# Patient Record
Sex: Male | Born: 1985 | Race: Black or African American | Hispanic: No | Marital: Single | Smoking: Former smoker
Health system: Southern US, Community
[De-identification: ages and names within clinical notes are randomized; demographics above are authoritative.]

## PROBLEM LIST (undated history)

## (undated) DIAGNOSIS — G43019 Migraine without aura, intractable, without status migrainosus: Secondary | ICD-10-CM

## (undated) DIAGNOSIS — R12 Heartburn: Secondary | ICD-10-CM

## (undated) DIAGNOSIS — G43909 Migraine, unspecified, not intractable, without status migrainosus: Secondary | ICD-10-CM

## (undated) HISTORY — DX: Heartburn: R12

## (undated) HISTORY — DX: Migraine without aura, intractable, without status migrainosus: G43.019

## (undated) HISTORY — PX: WISDOM TOOTH EXTRACTION: SHX21

## (undated) HISTORY — PX: KNEE ARTHROSCOPY: SUR90

## (undated) HISTORY — DX: Migraine, unspecified, not intractable, without status migrainosus: G43.909

---

## 2004-11-26 ENCOUNTER — Encounter: Admission: RE | Admit: 2004-11-26 | Discharge: 2004-11-26 | Payer: Self-pay | Admitting: Internal Medicine

## 2010-09-08 ENCOUNTER — Emergency Department (HOSPITAL_COMMUNITY): Admission: EM | Admit: 2010-09-08 | Discharge: 2010-09-08 | Payer: Self-pay | Admitting: Family Medicine

## 2011-01-22 LAB — POCT URINALYSIS DIPSTICK
Bilirubin Urine: NEGATIVE
Glucose, UA: NEGATIVE mg/dL
Ketones, ur: NEGATIVE mg/dL
Specific Gravity, Urine: 1.025 (ref 1.005–1.030)
Urobilinogen, UA: 0.2 mg/dL (ref 0.0–1.0)

## 2011-01-22 LAB — HEMOCCULT GUIAC POC 1CARD (OFFICE): Fecal Occult Bld: POSITIVE

## 2012-07-03 ENCOUNTER — Emergency Department (INDEPENDENT_AMBULATORY_CARE_PROVIDER_SITE_OTHER)
Admission: EM | Admit: 2012-07-03 | Discharge: 2012-07-03 | Disposition: A | Discharge status: Home / Self Care | Payer: 59 | Source: Home / Self Care

## 2012-07-03 ENCOUNTER — Encounter (HOSPITAL_COMMUNITY): Payer: Self-pay | Admitting: Emergency Medicine

## 2012-07-03 DIAGNOSIS — R21 Rash and other nonspecific skin eruption: Secondary | ICD-10-CM

## 2012-07-03 DIAGNOSIS — Z2089 Contact with and (suspected) exposure to other communicable diseases: Secondary | ICD-10-CM

## 2012-07-03 MED ORDER — PERMETHRIN 5 % EX CREA: TOPICAL_CREAM | CUTANEOUS | Status: AC

## 2012-07-03 NOTE — ED Notes (Signed)
Patient denies rash or bumps, no itching.  Patient reports close friend has been told they have scabies, he is around them a lot and is concerned for the same

## 2012-07-03 NOTE — ED Provider Notes (Signed)
History     CSN: [id number]  Arrival date & time 07/03/12  1318   None     Chief Complaint  Patient presents with  . Rash    (Consider location/radiation/quality/duration/timing/severity/associated sxs/prior treatment) Patient is a 26 y.o. male presenting with rash. The history is provided by the patient.  Rash   This patient reports scabies exposure, girlfriend nightly sleeping partner was diagnosed with scabies 3 days ago.  States itchy chest and arms.    Location: chest  Onset: 2 days ago   Course: unchanged Self-treated with: nothing          Improvement with treatment: no  History Itching: yes  Tenderness: no  New medications/antibiotics: no  Pet exposure: no  Recent travel or tropical exposure: no  New soaps, shampoos, detergent, clothing: no Tick/insect exposure: no   Red Flags Feeling ill: no Fever:no Facial/tongue swelling/difficulty breathing:  no  Diabetic or immunocompromised: no  History reviewed. No pertinent past medical history.  Past Surgical History  Procedure Date  . Knee arthroscopy     right and left knee    No family history on file.  History  Substance Use Topics  . Smoking status: Current Everyday Smoker  . Smokeless tobacco: Not on file  . Alcohol Use: Yes      Review of Systems  Constitutional: Negative.   Respiratory: Negative.   Cardiovascular: Negative.   Skin: Positive for rash.    Allergies  Review of patient's allergies indicates no known allergies.  Home Medications   Current Outpatient Rx  Name Route Sig Dispense Refill  . PERMETHRIN 5 % EX CREA  Apply to affected area once 60 g 0    BP 137/82  Pulse 78  Temp 97.8 F (36.6 C) (Oral)  Resp 14  SpO2 97%  Physical Exam  Nursing note and vitals reviewed. Constitutional: He is oriented to person, place, and time. Vital signs are normal. He appears well-developed and well-nourished. He is active and cooperative.  HENT:  Head: Normocephalic.  Eyes:  Conjunctivae are normal. Pupils are equal, round, and reactive to light. No scleral icterus.  Neck: Trachea normal. Neck supple.  Cardiovascular: Normal rate, regular rhythm and normal heart sounds.   Pulmonary/Chest: Effort normal and breath sounds normal. No respiratory distress.  Neurological: He is alert and oriented to person, place, and time. No cranial nerve deficit or sensory deficit.  Skin: Skin is warm and dry. Rash noted.       Anterior chest  Psychiatric: He has a normal mood and affect. His speech is normal and behavior is normal. Judgment and thought content normal. Cognition and memory are normal.    ED Course  Procedures (including critical care time)  Labs Reviewed - No data to display No results found.   1. Scabies exposure   2. Rash       MDM  Cool showers; avoid heat, sunlight and anything that makes condition worse.  Continue Zyrtec for at least seven days.  Begin Medrol dosepak tomorrow-follow instructions.  RTC if symptoms do not improve or begin to have problems swallowing, breathing or significant change in condition.        Johnsie Kindred, NP 07/03/12 2059

## 2012-07-04 NOTE — ED Provider Notes (Signed)
Medical screening examination/treatment/procedure(s) were performed by resident physician or non-physician practitioner and as supervising physician I was immediately available for consultation/collaboration.   Aziah Brostrom DOUGLAS MD.    Hoover Grewe D Dalanie Kisner, MD 07/04/12 1647 

## 2013-02-20 ENCOUNTER — Emergency Department (HOSPITAL_COMMUNITY)
Admission: EM | Admit: 2013-02-20 | Discharge: 2013-02-20 | Disposition: A | Discharge status: Home / Self Care | Payer: 59 | Source: Home / Self Care | Attending: Family Medicine | Admitting: Family Medicine

## 2013-02-20 ENCOUNTER — Encounter (HOSPITAL_COMMUNITY): Payer: Self-pay | Admitting: *Deleted

## 2013-02-20 DIAGNOSIS — J020 Streptococcal pharyngitis: Secondary | ICD-10-CM

## 2013-02-20 LAB — POCT RAPID STREP A: Streptococcus, Group A Screen (Direct): POSITIVE — AB

## 2013-02-20 MED ORDER — HYDROCODONE-ACETAMINOPHEN 7.5-325 MG/15ML PO SOLN: 10.0000 mL | Freq: Three times a day (TID) | ORAL | Status: DC | PRN

## 2013-02-20 MED ORDER — IBUPROFEN 600 MG PO TABS: 600.0000 mg | ORAL_TABLET | Freq: Three times a day (TID) | ORAL | Status: DC | PRN

## 2013-02-20 MED ORDER — AMOXICILLIN 500 MG PO CAPS: 500.0000 mg | ORAL_CAPSULE | Freq: Three times a day (TID) | ORAL | Status: DC

## 2013-02-20 NOTE — ED Notes (Signed)
C/O sore throat onset Wed.  Also has some nasal congestion, runny nose, productive cough without fevers.  Went to an urgent care Fri and was given Kenalog and Decadron injections; had neg strep.  States sore throat is actually getting worse; throat appears red and inflamed.  Had taken Tyl Severe Cold with some improvement in congestion.

## 2013-02-20 NOTE — ED Provider Notes (Signed)
History     CSN: [id number]  Arrival date & time 02/20/13  1554   First MD Initiated Contact with Patient 02/20/13 1606      Chief Complaint  Patient presents with  . Sore Throat    (Consider location/radiation/quality/duration/timing/severity/associated sxs/prior treatment) HPI Comments: 27 year old smoker male with otherwise no significant past medical history. Here complaining of nasal congestion, cough and runny nose for several days. Patient reports started to have a sore throat in the last 4 days. He was seen at another urgent care 2 days ago and had a steroid injection and had a negative strep test. Reports his sore throat is worse. Taking over-the-counter Tylenol without significant relief. Denies fever or chills. No headache or abdominal pain. No chest pain or difficulty breathing. No wheezing. No rash.   History reviewed. No pertinent past medical history.  Past Surgical History  Procedure Laterality Date  . Knee arthroscopy      right and left knee    No family history on file.  History  Substance Use Topics  . Smoking status: Current Some Day Smoker    Types: Cigars  . Smokeless tobacco: Not on file  . Alcohol Use: Yes     Comment: Drinks 4-5 drinks per day x 4-5 days/wk; combination beer and liquor      Review of Systems  Constitutional: Negative for fever, chills, diaphoresis and fatigue.  HENT: Positive for sore throat and rhinorrhea. Negative for trouble swallowing and neck pain.   Eyes: Negative for pain and redness.  Respiratory: Positive for cough. Negative for shortness of breath and wheezing.   Gastrointestinal: Negative for nausea, vomiting, abdominal pain and diarrhea.  Musculoskeletal: Negative for arthralgias.  Skin: Negative for rash.  Neurological: Negative for dizziness and headaches.    Allergies  Review of patient's allergies indicates no known allergies.  Home Medications   Current Outpatient Rx  Name  Route  Sig  Dispense  Refill   . amoxicillin (AMOXIL) 500 MG capsule   Oral   Take 1 capsule (500 mg total) by mouth 3 (three) times daily.   21 capsule   0   . HYDROcodone-acetaminophen (HYCET) 7.5-325 mg/15 ml solution   Oral   Take 10 mLs by mouth every 8 (eight) hours as needed for pain or cough.   120 mL   0   . ibuprofen (ADVIL,MOTRIN) 600 MG tablet   Oral   Take 1 tablet (600 mg total) by mouth every 8 (eight) hours as needed for pain.   30 tablet   0     BP 136/77  Pulse 72  Temp(Src) 98.5 F (36.9 C) (Oral)  Resp 16  SpO2 97%  Physical Exam  Nursing note and vitals reviewed. Constitutional: He is oriented to person, place, and time. He appears well-developed and well-nourished. No distress.  HENT:  Head: Atraumatic.  Right Ear: External ear normal.  Left Ear: External ear normal.  Nasal Congestion with erythema and swelling of nasal turbinates, clear rhinorrhea. Significant pharyngeal erythema no exudates. No uvula deviation. No trismus. TM's with increased vascular markings and some dullness bilaterally no swelling or bulging.  Eyes: Conjunctivae are normal. Right eye exhibits no discharge. Left eye exhibits no discharge. No scleral icterus.  Neck: No JVD present.  Cardiovascular: Normal rate, regular rhythm and normal heart sounds.   No murmur heard. Pulmonary/Chest: Effort normal and breath sounds normal. No respiratory distress. He has no wheezes. He has no rales. He exhibits no tenderness.  Abdominal: Soft. There  is no tenderness.  No hepatosplenomegaly  Lymphadenopathy:    He has cervical adenopathy.  Neurological: He is alert and oriented to person, place, and time.  Skin: No rash noted. He is not diaphoretic.    ED Course  Procedures (including critical care time)  Labs Reviewed  POCT RAPID STREP A ([facility] ONLY) - Abnormal; Notable for the following:    Streptococcus, Group A Screen (Direct) POSITIVE (*)    All other components within normal limits   No results  found.   1. Strep pharyngitis       MDM   Treated with amoxicillin, hydrocodone/acetaminophen and ibuprofen. Supportive care and red flags should prompt his return to medical attention discussed with patient and provided in writing.       Sharin Grave, MD 02/20/13 1751

## 2015-04-25 ENCOUNTER — Other Ambulatory Visit: Payer: Self-pay | Admitting: Family Medicine

## 2015-04-25 DIAGNOSIS — R0989 Other specified symptoms and signs involving the circulatory and respiratory systems: Secondary | ICD-10-CM

## 2015-04-26 ENCOUNTER — Ambulatory Visit
Admission: RE | Admit: 2015-04-26 | Discharge: 2015-04-26 | Disposition: A | Discharge status: Home / Self Care | Payer: 59 | Source: Ambulatory Visit | Attending: Family Medicine | Admitting: Family Medicine

## 2015-04-26 DIAGNOSIS — R0989 Other specified symptoms and signs involving the circulatory and respiratory systems: Secondary | ICD-10-CM

## 2016-04-24 ENCOUNTER — Other Ambulatory Visit: Payer: Self-pay | Admitting: Family Medicine

## 2016-04-24 DIAGNOSIS — R1032 Left lower quadrant pain: Secondary | ICD-10-CM

## 2016-05-06 ENCOUNTER — Ambulatory Visit
Admission: RE | Admit: 2016-05-06 | Discharge: 2016-05-06 | Disposition: A | Discharge status: Home / Self Care | Payer: 59 | Source: Ambulatory Visit | Attending: Family Medicine | Admitting: Family Medicine

## 2016-05-06 DIAGNOSIS — R1032 Left lower quadrant pain: Secondary | ICD-10-CM

## 2016-05-06 IMAGING — CT CT ABD-PELV W/ CM
2 of 4 series · 16 of 46 positions shown, 18 images · IV contrast (APPLIED)
Comparison: None.

CLINICAL DATA: Patient with left lower quadrant pain for multiple
months

EXAM:
CT ABDOMEN AND PELVIS WITH CONTRAST
TECHNIQUE: Multidetector CT imaging of the abdomen and pelvis was performed
using the standard protocol following bolus administration of
intravenous contrast.
CONTRAST:  100mL [2L] IOPAMIDOL ([2L]) INJECTION 61%

[Series 2: abd/pelvis w/cm · axial · 0.79mm/px · z∈[+563,+1008]mm · 13 of 99 slices shown, 15 images]
[im 5/99  soft-tissue]
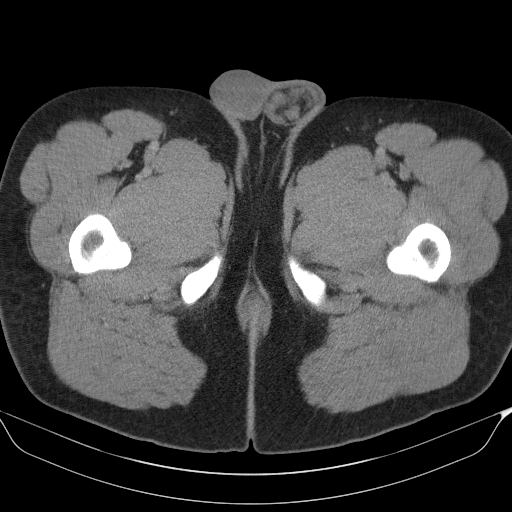
[im 5/99  bone]
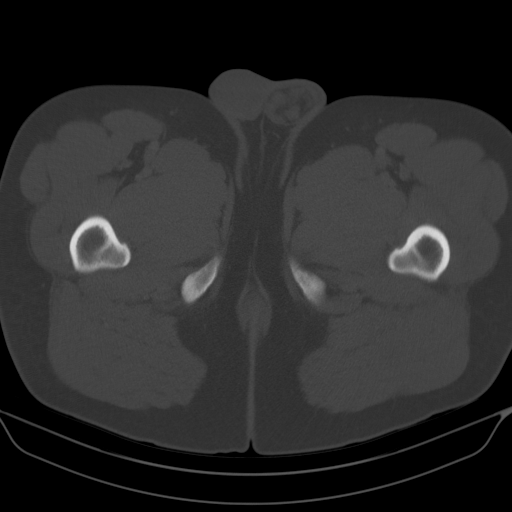
[im 13/99  soft-tissue]
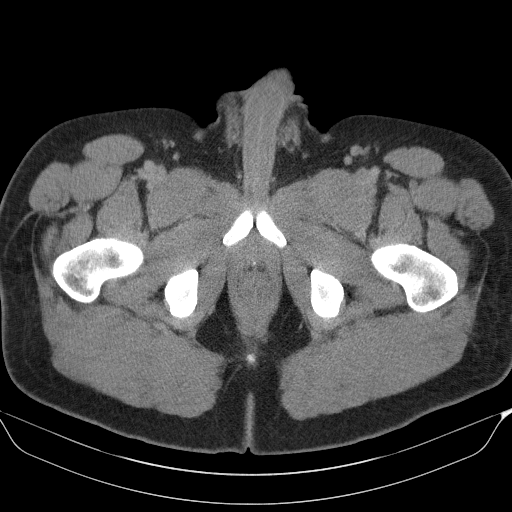
[im 21/99  soft-tissue]
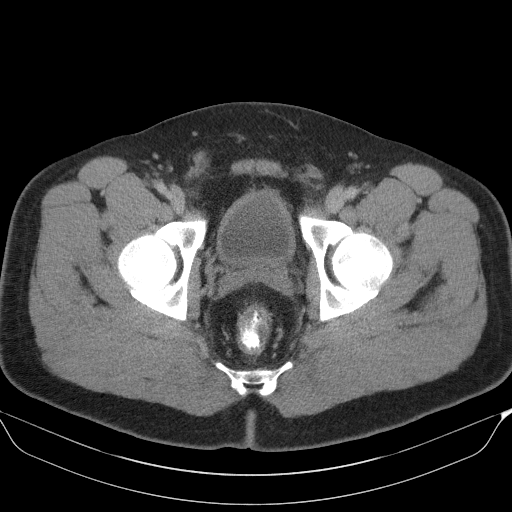
[im 29/99  soft-tissue]
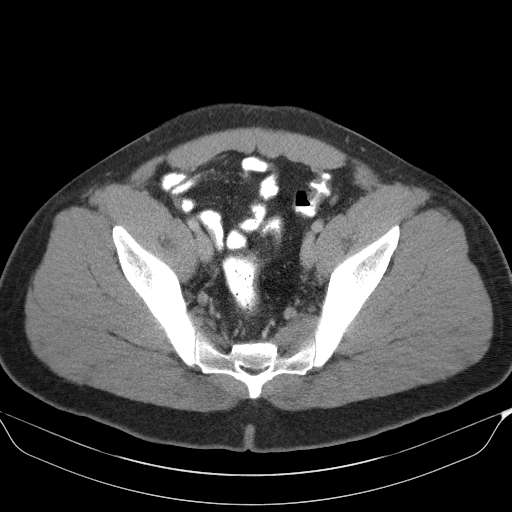
[im 33/99  soft-tissue]
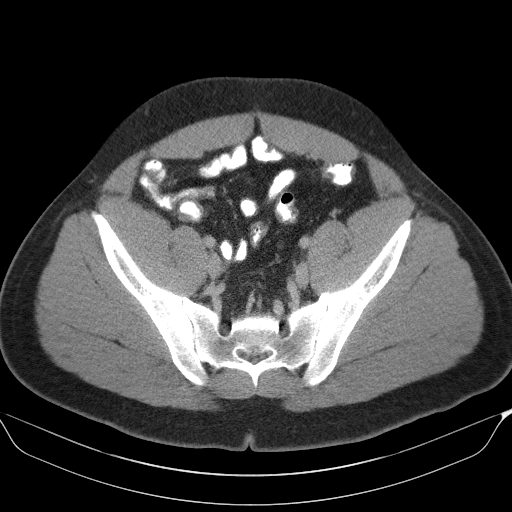
[im 41/99  soft-tissue]
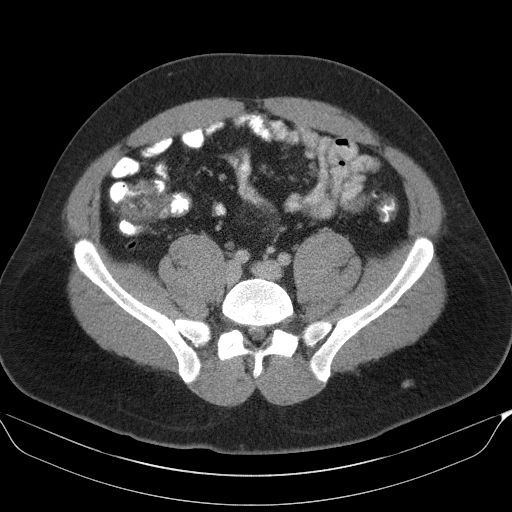
[im 50/99  soft-tissue]
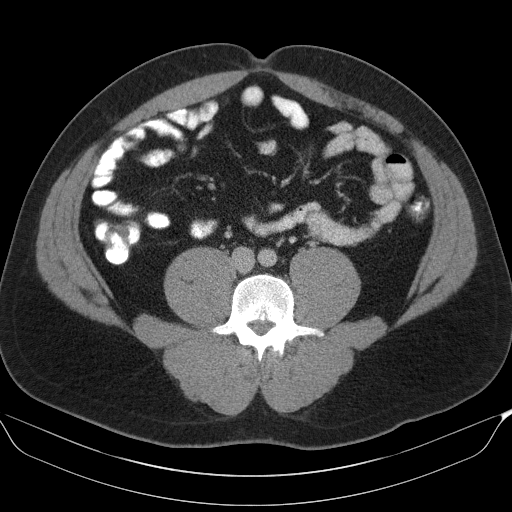
[im 58/99  soft-tissue]
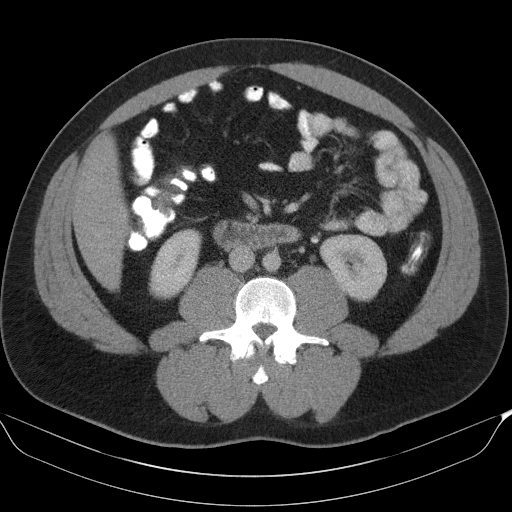
[im 66/99  soft-tissue]
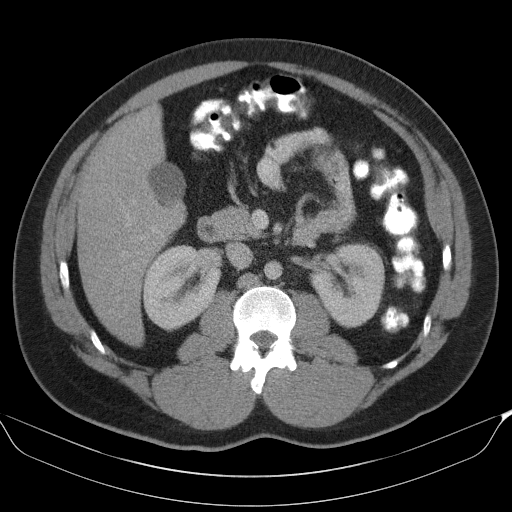
[im 66/99  bone]
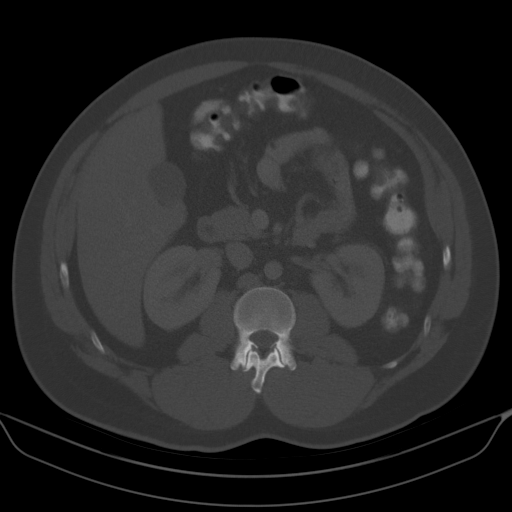
[im 70/99  soft-tissue]
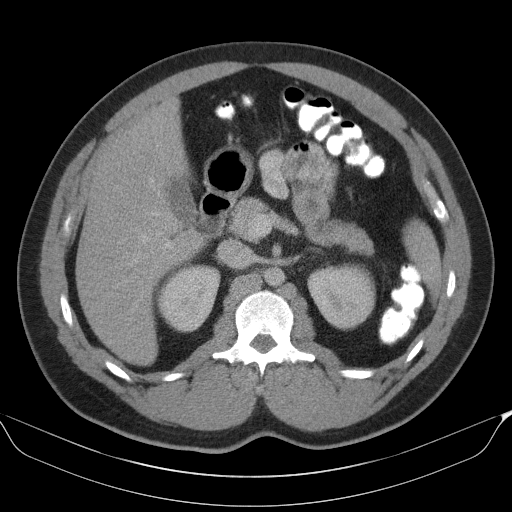
[im 78/99  soft-tissue]
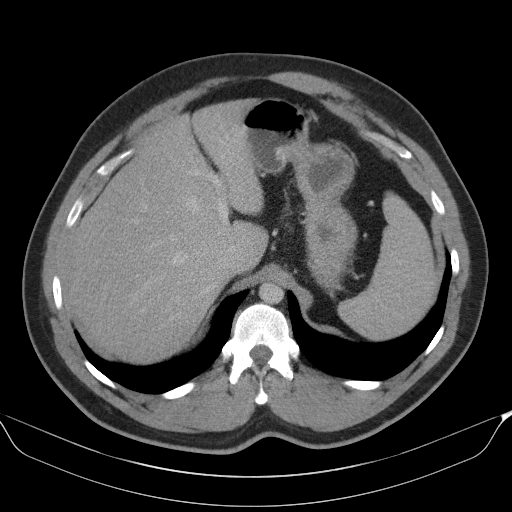
[im 86/99  soft-tissue]
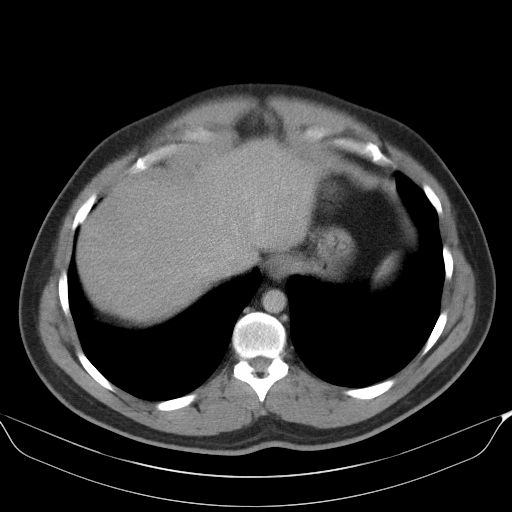
[im 94/99  soft-tissue]
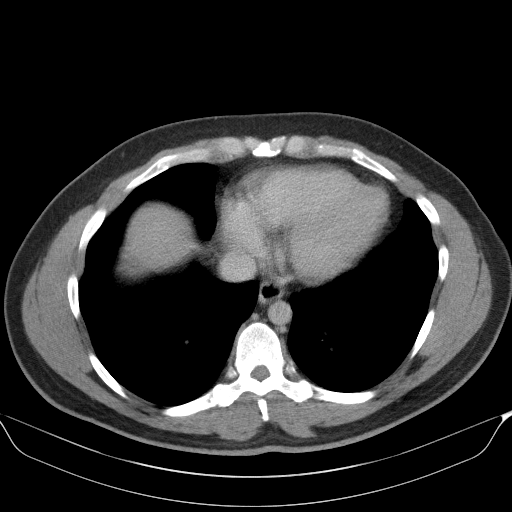

[Series 3: cor · coronal · 0.77mm/px · 3 of 108 slices shown]
[im 36/108  soft-tissue]
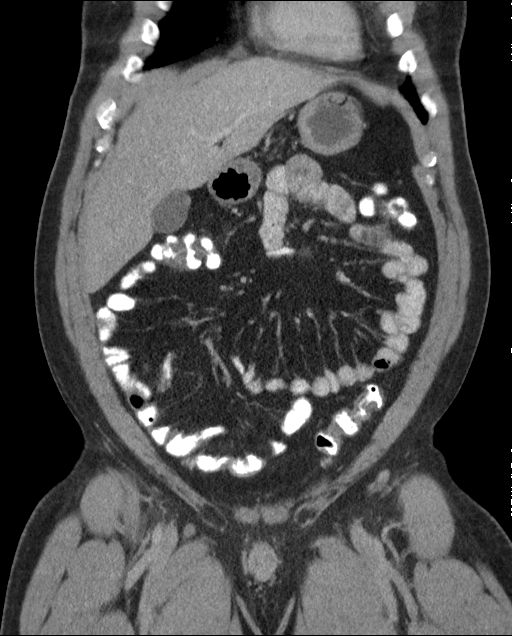
[im 48/108  soft-tissue]
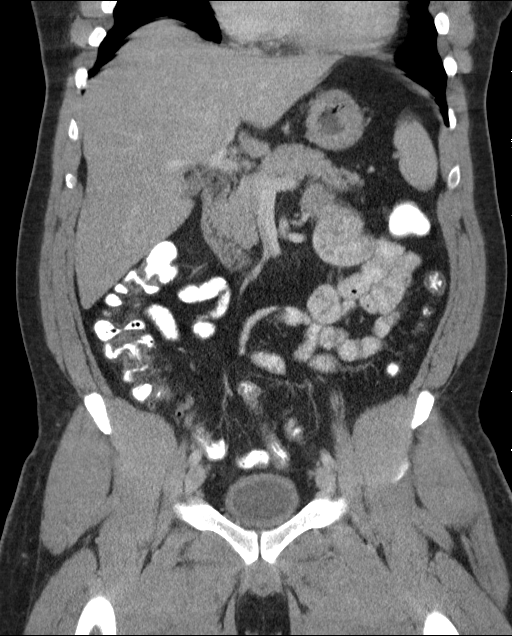
[im 60/108  soft-tissue]
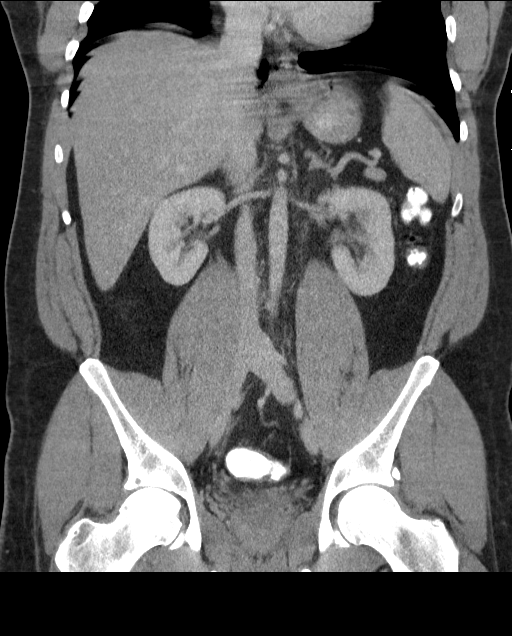

[16 of 46 positions shown; findings below may reference images not displayed]

FINDINGS: Lower chest: Normal heart size. Lung bases are clear. No
consolidative pulmonary opacities. No pleural effusion.

Hepatobiliary: Liver is normal in size and contour. Gallbladder is
unremarkable.

Pancreas: Unremarkable

Spleen: Unremarkable

Adrenals/Urinary Tract: The adrenal glands are normal. Kidneys
enhance symmetrically with contrast. No hydronephrosis. Urinary
bladder is unremarkable.

Stomach/Bowel: No abnormal bowel wall thickening or evidence for
bowel obstruction. No free fluid or free intraperitoneal air. Normal
morphology of the stomach. Normal appendix.

Vascular/Lymphatic: Normal caliber abdominal aorta. No
retroperitoneal lymphadenopathy.

Other: Prostate unremarkable.

Musculoskeletal: No aggressive or acute appearing osseous lesions.
IMPRESSION: No acute process within the abdomen or pelvis.

Normal appendix.

## 2016-05-06 MED ORDER — IOPAMIDOL (ISOVUE-300) INJECTION 61%
100.0000 mL | Freq: Once | INTRAVENOUS | Status: AC | PRN
  Administered 2016-05-06: 100 mL via INTRAVENOUS

## 2017-02-12 ENCOUNTER — Encounter: Payer: Self-pay | Admitting: Neurology

## 2017-02-12 ENCOUNTER — Ambulatory Visit (INDEPENDENT_AMBULATORY_CARE_PROVIDER_SITE_OTHER): Payer: 59 | Admitting: Neurology

## 2017-02-12 ENCOUNTER — Encounter (INDEPENDENT_AMBULATORY_CARE_PROVIDER_SITE_OTHER): Payer: Self-pay

## 2017-02-12 DIAGNOSIS — G43019 Migraine without aura, intractable, without status migrainosus: Secondary | ICD-10-CM

## 2017-02-12 HISTORY — DX: Migraine without aura, intractable, without status migrainosus: G43.019

## 2017-02-12 MED ORDER — TOPIRAMATE 25 MG PO TABS: ORAL_TABLET | ORAL | 3 refills | Status: DC

## 2017-02-12 MED ORDER — ZOLMITRIPTAN 5 MG NA SOLN: 1.0000 | Freq: Two times a day (BID) | NASAL | 3 refills | Status: DC | PRN

## 2017-02-12 NOTE — Progress Notes (Signed)
Reason for visit: Migraine headache  Referring physician: Dr. Alvester Morin [name] is a 31 y.o. male  History of present illness:  Timothy [name] is a 31 year old right-handed black male with a history of intractable migraine headache. The patient claims he began having migraine headaches in his junior year of high school. The headaches dissipated in his 52s, but within the last 3 years his headaches have become more frequent. The patient is now having headaches 2 or 3 times a week. He is missing work because of this. The patient will usually have a headache beginning around the left brow, sometimes going across to the right forehead. The patient may have blurred vision, he will have nausea but no vomiting. He may experience photophobia and phonophobia, he does not have confusion with the headache. The patient denies any numbness or weakness of the face, arms, or legs. The patient does not note any particular activating factors for the headache. In the past he has had good improvement with Zomig nasal spray, he is on oral Imitrex at this time, he claims that Imitrex makes him feel bad and does not always work for his headache. The patient may also take ibuprofen. The patient claims that the headaches may last 6-8 hours. He denies a family history of migraine, he denies any significant neck stiffness with the headache. The patient does not drink caffeinated products during the day. He is sent to this office for an evaluation. He currently has FMLA for work.  Past Medical History:  Diagnosis Date  . Migraines     Past Surgical History:  Procedure Laterality Date  . KNEE ARTHROSCOPY     right and left knee    No family history on file.  Social history:  reports that he has been smoking Cigars.  He does not have any smokeless tobacco history on file. He reports that he drinks alcohol. He reports that he uses drugs, including Marijuana.  Medications:  Prior to Admission medications     Medication Sig Start Date End Date Taking? Authorizing Provider  amoxicillin (AMOXIL) 500 MG capsule Take 1 capsule (500 mg total) by mouth 3 (three) times daily. 02/20/13   Timothy Moreno-Coll, MD  HYDROcodone-acetaminophen (HYCET) 7.5-325 mg/15 ml solution Take 10 mLs by mouth every 8 (eight) hours as needed for pain or cough. 02/20/13   Timothy Moreno-Coll, MD  ibuprofen (ADVIL,MOTRIN) 600 MG tablet Take 1 tablet (600 mg total) by mouth every 8 (eight) hours as needed for pain. 02/20/13   Timothy Moreno-Coll, MD     No Known Allergies  ROS:  Out of a complete 14 system review of symptoms, the patient complains only of the following symptoms, and all other reviewed systems are negative.  Migraine headache  Blood pressure (!) 147/90, pulse 62, height  (1.854 m), weight 259 lb (117.5 kg).  Physical Exam  General: The patient is alert and cooperative at the time of the examination. The patient is minimally obese.  Eyes: Pupils are equal, round, and reactive to light. Discs are flat bilaterally.  Neck: The neck is supple, no carotid bruits are noted.  Respiratory: The respiratory examination is clear.  Cardiovascular: The cardiovascular examination reveals a regular rate and rhythm, no obvious murmurs or rubs are noted.  Skin: Extremities are without significant edema.  Neurologic Exam  Mental status: The patient is alert and oriented x 3 at the time of the examination. The patient has apparent normal recent and remote memory, with an apparently normal  attention span and concentration ability.  Cranial nerves: Facial symmetry is present. There is good sensation of the face to pinprick and soft touch bilaterally. The strength of the facial muscles and the muscles to head turning and shoulder shrug are normal bilaterally. Speech is well enunciated, no aphasia or dysarthria is noted. Extraocular movements are full. Visual fields are full. The tongue is midline, and the patient has  symmetric elevation of the soft palate. No obvious hearing deficits are noted.  Motor: The motor testing reveals 5 over 5 strength of all 4 extremities. Good symmetric motor tone is noted throughout.  Sensory: Sensory testing is intact to pinprick, soft touch, vibration sensation, and position sense on all 4 extremities. No evidence of extinction is noted.  Coordination: Cerebellar testing reveals good finger-nose-finger and heel-to-shin bilaterally.  Gait and station: Gait is normal. Tandem gait is normal. Romberg is negative. No drift is seen.  Reflexes: Deep tendon reflexes are symmetric and normal bilaterally. Toes are downgoing bilaterally.   Assessment/Plan:  1. Intractable common migraine headache  The patient has never been on any daily prophylactic medications for his migraine. He will be started on Topamax, the Imitrex will be discontinued and Zomig nasal spray will be used. The patient will follow-up in 3 months. He will contact our office for any dose adjustments on the medication.   Timothy Palau MD 02/12/2017 11:38 AM  [facility] [address] [place], [place] [address]  Phone [phone] Fax [phone]

## 2017-02-12 NOTE — Patient Instructions (Signed)
   We will start Topamax for the headache, to be taken daily. Zomig NS will be used for the headache if needed.   Topamax (topiramate) is a seizure medication that has an FDA approval for seizures and for migraine headache. Potential side effects of this medication include weight loss, cognitive slowing, tingling in the fingers and toes, and carbonated drinks will taste bad. If any significant side effects are noted on this drug, please contact our office.

## 2017-04-03 ENCOUNTER — Other Ambulatory Visit: Payer: Self-pay | Admitting: *Deleted

## 2017-04-03 MED ORDER — TOPIRAMATE 25 MG PO TABS: ORAL_TABLET | ORAL | 0 refills | Status: DC

## 2017-05-21 ENCOUNTER — Ambulatory Visit: Payer: 59 | Admitting: Adult Health

## 2017-05-28 ENCOUNTER — Encounter: Payer: Self-pay | Admitting: Adult Health

## 2017-05-28 ENCOUNTER — Ambulatory Visit (INDEPENDENT_AMBULATORY_CARE_PROVIDER_SITE_OTHER): Payer: 59 | Admitting: Adult Health

## 2017-05-28 VITALS — BP 128/82 | HR 84 | Ht 73.0 in | Wt 259.4 lb

## 2017-05-28 DIAGNOSIS — G43009 Migraine without aura, not intractable, without status migrainosus: Secondary | ICD-10-CM

## 2017-05-28 MED ORDER — TOPIRAMATE 25 MG PO TABS: 100.0000 mg | ORAL_TABLET | Freq: Every day | ORAL | 3 refills | Status: DC

## 2017-05-28 NOTE — Patient Instructions (Signed)
Your Plan:  Increase Topamax to 100 mg at bedtime Continue Zomig for acute migraine treatment If your symptoms worsen or you develop new symptoms please let us know.    [facility] you [facility]. I hope we have been able to provide you high quality care today.  You may receive a patient satisfaction survey over the next few weeks. We would appreciate your feedback and comments so that we may continue to improve ourselves and the health of our patients.

## 2017-05-28 NOTE — Progress Notes (Signed)
PATIENT: Timothy HeinzBrandon [name] DOB: 1986/04/01  REASON FOR VISIT: follow up- migraine headaches HISTORY FROM: patient  HISTORY OF PRESENT ILLNESS: Today 05/28/17 Timothy [name] is a 31 year old male with a history of migraine headaches. He returns today for follow-up. At the last visit he was started on Topamax 75 mg at bedtime as well as Zomig. He reports this has been beneficial for his headache. He has approximately 1-2 headaches a week. They occur on the left side of the head. With his headaches he does have photophobia and phonophobia but denies nausea and vomiting. He states that when he takes zomig his headache typically resolves within the hour. He denies any new neurological symptoms. He returns today for an evaluation.  HISTORY 02/12/17: Timothy [name] is a 31 year old right-handed black male with a history of intractable migraine headache. The patient claims he began having migraine headaches in his junior year of high school. The headaches dissipated in his 1420s, but within the last 3 years his headaches have become more frequent. The patient is now having headaches 2 or 3 times a week. He is missing work because of this. The patient will usually have a headache beginning around the left brow, sometimes going across to the right forehead. The patient may have blurred vision, he will have nausea but no vomiting. He may experience photophobia and phonophobia, he does not have confusion with the headache. The patient denies any numbness or weakness of the face, arms, or legs. The patient does not note any particular activating factors for the headache. In the past he has had good improvement with Zomig nasal spray, he is on oral Imitrex at this time, he claims that Imitrex makes him feel bad and does not always work for his headache. The patient may also take ibuprofen. The patient claims that the headaches may last 6-8 hours. He denies a family history of migraine, he denies any significant neck stiffness  with the headache. The patient does not drink caffeinated products during the day. He is sent to this office for an evaluation. He currently has FMLA for work.   REVIEW OF SYSTEMS: Out of a complete 14 system review of symptoms, the patient complains only of the following symptoms, and all other reviewed systems are negative.  Headache  ALLERGIES: No Known Allergies  HOME MEDICATIONS: Outpatient Medications Prior to Visit  Medication Sig Dispense Refill  . methocarbamol (ROBAXIN) 500 MG tablet Take 500 mg by mouth every 6 (six) hours as needed for muscle spasms.    Marland Kitchen. omeprazole (PRILOSEC) 20 MG capsule Take 20 mg by mouth daily.    Marland Kitchen. topiramate (TOPAMAX) 25 MG tablet Take 3 tablets at night by mouth 270 tablet 0  . zolmitriptan (ZOMIG) 5 MG nasal solution Place 1 spray into the nose 2 (two) times daily as needed for migraine. 6 Units 3   No facility-administered medications prior to visit.     PAST MEDICAL HISTORY: Past Medical History:  Diagnosis Date  . Common migraine with intractable migraine 02/12/2017  . Heart burn   . Migraines     PAST SURGICAL HISTORY: Past Surgical History:  Procedure Laterality Date  . KNEE ARTHROSCOPY     right and left knee x2    FAMILY HISTORY: Family History  Problem Relation Age of Onset  . Diabetes Mother   . Prostate cancer Father     SOCIAL HISTORY: Social History   Social History  . Marital status: Single    Spouse name: N/A  .  Number of children: N/A  . Years of education: N/A   Occupational History  . Not on file.   Social History Main Topics  . Smoking status: Former Smoker    Types: Cigars    Quit date: 05/10/2017  . Smokeless tobacco: Never Used  . Alcohol use Yes     Comment: Drinks once per week  . Drug use: Yes    Types: Marijuana     Comment: 3-4 times per month  . Sexual activity: Not on file   Other Topics Concern  . Not on file   Social History Narrative   Lives alone   Right-handed   Caffeine use:   Tea sometimes      PHYSICAL EXAM  Vitals:   05/28/17 0917  BP: 128/82  Pulse: 84  Weight: 259 lb 6.4 oz (117.7 kg)  Height: 6\' 1"  (1.854 m)   Body mass index is 34.22 kg/m.  Generalized: Well developed, in no acute distress   Neurological examination  Mentation: Alert oriented to time, place, history taking. Follows all commands speech and language fluent Cranial nerve II-XII: Pupils were equal round reactive to light. Extraocular movements were full, visual field were full on confrontational test. Facial sensation and strength were normal. Uvula tongue midline. Head turning and shoulder shrug  were normal and symmetric. Motor: The motor testing reveals 5 over 5 strength of all 4 extremities. Good symmetric motor tone is noted throughout.  Sensory: Sensory testing is intact to soft touch on all 4 extremities. No evidence of extinction is noted.  Coordination: Cerebellar testing reveals good finger-nose-finger and heel-to-shin bilaterally.  Gait and station: Gait is normal. Tandem gait is normal. Romberg is negative. No drift is seen.  Reflexes: Deep tendon reflexes are symmetric and normal bilaterally.   DIAGNOSTIC DATA (LABS, IMAGING, TESTING) - I reviewed patient records, labs, notes, testing and imaging myself where available.    ASSESSMENT AND PLAN 31 y.o. year old male  has a past medical history of Common migraine with intractable migraine (02/12/2017); Heart burn; and Migraines. here with:  1. Migraine headache  The patient has gained benefit from Topamax. We will increase to 100 mg to see if this will further decrease his headache frequency. He will continue using Zomig as an acute therapy. He is advised that if his symptoms worsen or he develops new symptoms he should let us know. He will follow-up in 6 months or sooner if needed.  I spent 15 minutes with the patient. 50% of this time was spent discussing his medication-Topamax     Butch Penny, MSN, NP-C  05/28/2017, 9:19 AM [facility] [address] [place], [place] [address] [phone]

## 2017-05-28 NOTE — Progress Notes (Signed)
I have read the note, and I agree with the clinical assessment and plan.  Timothy [name],Timothy [name]   

## 2017-07-20 ENCOUNTER — Telehealth: Payer: Self-pay | Admitting: *Deleted

## 2017-07-20 NOTE — Telephone Encounter (Signed)
[employer] FML[employer] papers on Dolores HooseM Mill[place], NP's desk for review, signature.

## 2017-07-21 NOTE — Telephone Encounter (Signed)
[employer] FML[employer] papers completed, signed, sent to medical records for processing.

## 2017-07-29 DIAGNOSIS — Z0289 Encounter for other administrative examinations: Secondary | ICD-10-CM

## 2017-11-30 ENCOUNTER — Encounter: Payer: Self-pay | Admitting: Adult Health

## 2017-11-30 ENCOUNTER — Ambulatory Visit (INDEPENDENT_AMBULATORY_CARE_PROVIDER_SITE_OTHER): Payer: 59 | Admitting: Adult Health

## 2017-11-30 VITALS — BP 124/72 | HR 70 | Wt 265.0 lb

## 2017-11-30 DIAGNOSIS — G43009 Migraine without aura, not intractable, without status migrainosus: Secondary | ICD-10-CM | POA: Diagnosis not present

## 2017-11-30 NOTE — Patient Instructions (Signed)
Your Plan:  Continue Zomig Can consider Zonegran in the future for headaches.  [facility] you [facility] at [facility]. I hope we have been able to provide you high quality care today.  You may receive a patient satisfaction survey over the next few weeks. We would appreciate your feedback and comments so that we may continue to improve ourselves and the health of our patients.

## 2017-11-30 NOTE — Progress Notes (Signed)
PATIENT: Timothy [name] DOB: 1986/07/08  REASON FOR VISIT: follow up HISTORY FROM: patient  HISTORY OF PRESENT ILLNESS: Today 11/30/17 Mr. Slocumb is a 32 year old male with a history of migraine headaches.  He returns today for follow-up.  At the last visit we increase Topamax to 100 mg at bedtime.  He reports that it was beneficial for his headaches but he began to notice problems with his memory.  He states that he stopped taking Topamax.  He reports that he is done well.  In the last month he is only had 4 migraines.  He reports that they typically resolve with Zomig however he may have 1-2 headaches that may last greater than 24 hours.  With this headaches they always occur in the left temporal region.  He does have photophobia and phonophobia but denies nausea and vomiting he returns today for an evaluation.  HISTORY 05/28/17 Mr. Gift is a 32 year old male with a history of migraine headaches. He returns today for follow-up. At the last visit he was started on Topamax 75 mg at bedtime as well as Zomig. He reports this has been beneficial for his headache. He has approximately 1-2 headaches a week. They occur on the left side of the head. With his headaches he does have photophobia and phonophobia but denies nausea and vomiting. He states that when he takes zomig his headache typically resolves within the hour. He denies any new neurological symptoms. He returns today for an evaluation.   REVIEW OF SYSTEMS: Out of a complete 14 system review of symptoms, the patient complains only of the following symptoms, and all other reviewed systems are negative.  See HPI  ALLERGIES: No Known Allergies  HOME MEDICATIONS: Outpatient Medications Prior to Visit  Medication Sig Dispense Refill  . methocarbamol (ROBAXIN) 500 MG tablet Take 500 mg by mouth every 6 (six) hours as needed for muscle spasms.    Marland Kitchen omeprazole (PRILOSEC) 20 MG capsule Take 20 mg by mouth daily.    Marland Kitchen zolmitriptan (ZOMIG)  5 MG nasal solution Place 1 spray into the nose 2 (two) times daily as needed for migraine. 6 Units 3  . topiramate (TOPAMAX) 25 MG tablet Take 4 tablets (100 mg total) by mouth at bedtime. (Patient not taking: Reported on 11/30/2017) 360 tablet 3   No facility-administered medications prior to visit.     PAST MEDICAL HISTORY: Past Medical History:  Diagnosis Date  . Common migraine with intractable migraine 02/12/2017  . Heart burn   . Migraines     PAST SURGICAL HISTORY: Past Surgical History:  Procedure Laterality Date  . KNEE ARTHROSCOPY     right and left knee x2    FAMILY HISTORY: Family History  Problem Relation Age of Onset  . Diabetes Mother   . Prostate cancer Father     SOCIAL HISTORY: Social History   Socioeconomic History  . Marital status: Single    Spouse name: Not on file  . Number of children: Not on file  . Years of education: Not on file  . Highest education level: Not on file  Social Needs  . Financial resource strain: Not on file  . Food insecurity - worry: Not on file  . Food insecurity - inability: Not on file  . Transportation needs - medical: Not on file  . Transportation needs - non-medical: Not on file  Occupational History  . Not on file  Tobacco Use  . Smoking status: Former Smoker    Types: Cigars  Last attempt to quit: 05/10/2017    Years since quitting: 0.5  . Smokeless tobacco: Never Used  Substance and Sexual Activity  . Alcohol use: Yes    Comment: Drinks once per week  . Drug use: Yes    Types: Marijuana    Comment: 3-4 times per month  . Sexual activity: Not on file  Other Topics Concern  . Not on file  Social History Narrative   Lives alone   Right-handed   Caffeine use:  Tea sometimes      PHYSICAL EXAM  Vitals:   11/30/17 1257  BP: 124/72  Pulse: 70  Weight: 265 lb (120.2 kg)   Body mass index is 34.96 kg/m.  Generalized: Well developed, in no acute distress   Neurological examination  Mentation:  Alert oriented to time, place, history taking. Follows all commands speech and language fluent Cranial nerve II-XII: Pupils were equal round reactive to light. Extraocular movements were full, visual field were full on confrontational test. Facial sensation and strength were normal. Uvula tongue midline. Head turning and shoulder shrug  were normal and symmetric. Motor: The motor testing reveals 5 over 5 strength of all 4 extremities. Good symmetric motor tone is noted throughout.  Sensory: Sensory testing is intact to soft touch on all 4 extremities. No evidence of extinction is noted.  Coordination: Cerebellar testing reveals good finger-nose-finger and heel-to-shin bilaterally.  Gait and station: Gait is normal.  Reflexes: Deep tendon reflexes are symmetric and normal bilaterally.   DIAGNOSTIC DATA (LABS, IMAGING, TESTING) - I reviewed patient records, labs, notes, testing and imaging myself where available.     ASSESSMENT AND PLAN 32 y.o. year old male  has a past medical history of Common migraine with intractable migraine (02/12/2017), Heart burn, and Migraines. here with:   1.  Migraine headaches  Overall the patient is doing well.  He will continue to use Zomig for acute treatment of his headaches.  For now he does not want to go on preventative medication.  He is advised that if his headache frequency increases he should let us know.  In the future we may can try Zonegran if needed.  I spent 15 minutes with the patient. 50% of this time was spent discussing his migraine headaches as well as medication.      Butch PennyMegan Brolin Dambrosia, MSN, NP-C 11/30/2017, 1:03 PM [facility] [address], [address] [place], [place] [address] [phone]

## 2017-11-30 NOTE — Progress Notes (Signed)
I have read the note, and I agree with the clinical assessment and plan.  Terrel Nesheiwat K Courteney Alderete   

## 2017-12-11 ENCOUNTER — Telehealth: Payer: Self-pay | Admitting: *Deleted

## 2017-12-11 NOTE — Telephone Encounter (Signed)
Pt [employer] form on [employer] C desk.

## 2017-12-14 DIAGNOSIS — Z0289 Encounter for other administrative examinations: Secondary | ICD-10-CM

## 2017-12-14 NOTE — Telephone Encounter (Signed)
[employer] FMLA forms on M Millikan, NP's desk for review, signature.

## 2017-12-15 ENCOUNTER — Telehealth: Payer: Self-pay | Admitting: Neurology

## 2017-12-15 NOTE — Telephone Encounter (Signed)
Left voicemail for pt to let him know his FMLA forms are ready to be picked up at check in.

## 2017-12-15 NOTE — Telephone Encounter (Signed)
Signed, to MR.

## 2018-06-03 ENCOUNTER — Encounter: Payer: Self-pay | Admitting: Adult Health

## 2018-06-03 ENCOUNTER — Ambulatory Visit (INDEPENDENT_AMBULATORY_CARE_PROVIDER_SITE_OTHER): Payer: 59 | Admitting: Adult Health

## 2018-06-03 VITALS — BP 128/83 | HR 73 | Ht 72.0 in | Wt 261.4 lb

## 2018-06-03 DIAGNOSIS — G43009 Migraine without aura, not intractable, without status migrainosus: Secondary | ICD-10-CM

## 2018-06-03 MED ORDER — ZONISAMIDE 25 MG PO CAPS: ORAL_CAPSULE | ORAL | 11 refills | Status: DC

## 2018-06-03 NOTE — Progress Notes (Signed)
I have read the note, and I agree with the clinical assessment and plan.  Charles K Willis   

## 2018-06-03 NOTE — Patient Instructions (Addendum)
Your Plan:  Start Zonegran Continue Zomig  If your symptoms worsen or you develop new symptoms please let us know.   Thank you for coming to see [place] at [facility]. I hope we have been able to provide you high quality care today.  You may receive a patient satisfaction survey over the next few weeks. We would appreciate your feedback and comments so that we may continue to improve ourselves and the health of our patients.  Zonisamide capsules What is this medicine? ZONISAMIDE (zoe NIS a mide) is used to control partial seizures in adults with epilepsy. This medicine may be used for other purposes; ask your health care provider or pharmacist if you have questions. COMMON BRAND NAME(S): Zonegran What should I tell my health care provider before I take this medicine? They need to know if you have any of these conditions: -dehydrated -diarrhea -history of metabolic acidosis (too much acid in your blood) -ketogenic diet -kidney disease -liver disease -lung disease -osteoporosis -suicidal thoughts, plans, or attempt; a previous suicide attempt by you or a family member -an unusual or allergic reaction to zonisamide, sulfa drugs, other medicines, foods, dyes, or preservatives -pregnant or trying to get pregnant -breast-feeding How should I use this medicine? Take this medicine by mouth with a glass of water. Follow the directions on the prescription label. Swallow whole. Do not break open the capsule. This medicine may be taken with or without food. Take your doses at regular intervals. Do not take your medicine more often than directed. Do not stop taking this medicine unless instructed by your doctor or health care professional. A special MedGuide will be given to you by the pharmacist with each prescription and refill. Be sure to read this information carefully each time. Talk to your pediatrician regarding the use of this medicine in children. While this drug may be  prescribed for children as young as 55 years of age for selected conditions, precautions do apply. Overdosage: If you think you have taken too much of this medicine contact a poison control center or emergency room at once. NOTE: This medicine is only for you. Do not share this medicine with others. What if I miss a dose? If you miss a dose, take it as soon as you can. If it is almost time for your next dose, take only that dose. Do not take double or extra doses. What may interact with this medicine? This medicine may interact with the following medications -alcohol -antihistamines for allergy, cough and cold -antiviral medicines for HIV or AIDS -certain antibiotics like rifabutin, rifampin -certain medicines for anxiety or sleep -certain medicines for depression like amitriptyline, fluoxetine, sertraline -certain medicines for seizures like carbamazepine, phenobarbital, phenytoin, topiramate -digoxin -diuretics like acetazolamide, dichlorphenamide -general anesthetics like halothane, isoflurane, methoxyflurane, propofol -local anesthetics like lidocaine, pramoxine, tetracaine -medicines that relax muscles for surgery -narcotic medicines for pain -phenothiazines like chlorpromazine, mesoridazine, prochlorperazine, thioridazine -quinidine This list may not describe all possible interactions. Give your health care provider a list of all the medicines, herbs, non-prescription drugs, or dietary supplements you use. Also tell them if you smoke, drink alcohol, or use illegal drugs. Some items may interact with your medicine. What should I watch for while using this medicine? Visit your doctor or health care professional for regular checks on your progress. Wear a medical identification bracelet or chain to say you have epilepsy, and carry a card that lists all your medications. It is important to take this medicine exactly as directed.  When first starting treatment, your dose will need to be  adjusted slowly. It may take weeks or months before your dose is stable. You should contact your doctor or health care professional if your seizures get worse or if you have any new types of seizures. Do not stop taking except on your doctor's advice. You may develop a severe reaction. Your doctor will tell you how much medicine to take. You may get drowsy, dizzy, or have blurred vision. Do not drive, use machinery, or do anything that needs mental alertness until you know how this medicine affects you. To reduce dizzy or fainting spells, do not sit or stand up quickly, especially if you are an older patient. Alcohol can increase drowsiness and dizziness. Avoid alcoholic drinks. Avoid extreme heat. This medicine can cause you to sweat less than normal. Your body temperature could increase to dangerous levels, which may lead to heat stroke. This medicine may increase the chance of developing metabolic acidosis. If left untreated, this can cause kidney stones, bone disease, or slowed growth in children. Symptoms include breathing fast, fatigue, loss of appetite, irregular heartbeat, or loss of consciousness. Call your doctor immediately if you experience any of these side effects. Also, tell your doctor about any surgery you plan on having while taking this medicine since this may increase your risk for metabolic acidosis. This medicines may increase the risk of kidney stones. Drinking 6 to 8 glasses of water a day may help prevent the formation of kidney stones. The use of this medicine may increase the chance of suicidal thoughts or actions. Pay special attention to how you are responding while on this medicine. Any worsening of mood, or thoughts of suicide or dying should be reported to your health care professional right away. Women who become pregnant while using this medicine may enroll in the [place] Antiepileptic Drug Pregnancy Registry by calling [id number]. This registry collects information  about the safety of antiepileptic drug use during pregnancy. What side effects may I notice from receiving this medicine? Side effects that you should report to your doctor or health care professional as soon as possible: -allergic reactions like skin rash, itching or hives, swelling of the face, lips, or tongue -decreased sweating or a rise in body temperature, especially in patients under 54 years old -difficulty breathing or tightening of the throat -feeling faint or lightheaded, falls -fever, sore throat, sores in your mouth, or bruising easily -hallucination, loss of contact with reality -irregular heartbeat -loss of appetite -redness, blistering, peeling or loosening of the skin, including inside the mouth -severe drowsiness, difficulty concentrating, or coordination problems -speech or language problems -sudden back pain, abdominal pain, pain when urinating, bloody or dark urine -suicidal thoughts or depression -unusual changes in behavior or mood -unusually weak or tired -vomiting Side effects that usually do not require medical attention (report to your doctor or health care professional if they continue or are bothersome): -headache -nausea This list may not describe all possible side effects. Call your doctor for medical advice about side effects. You may report side effects to FDA at 1-800-FDA-1088. Where should I keep my medicine? Keep out of reach of children. Store at room temperature between 15 and 30 degrees C (59 and 86 degrees F). Keep in a dry place protected from light. Throw away any unused medicine after the expiration date. NOTE: This sheet is a summary. It may not cover all possible information. If you have questions about this medicine, talk to your doctor, pharmacist,  or health care provider.  2018 Elsevier/Gold Standard (2015-11-29 09:50:49)

## 2018-06-03 NOTE — Progress Notes (Signed)
PATIENT: Timothy [name] DOB: [birth year]  REASON FOR VISIT: follow up-migraine headaches HISTORY FROM: patient  HISTORY OF PRESENT ILLNESS: Today 06/03/18 Timothy [name] is a 32 year old male with a history of migraine headaches.  He returns today for follow-up.  He is currently only using Zomig for acute therapy.  He reports that his headache frequency has began to increase.  He is having at least one headache a week that can sometimes last 1 to 2 days.  He reports that they typically occur on the right side.  He has noticed blurred vision, photophobia but denies phonophobia, nausea and vomiting.  He reports that Zomig does offer him some benefit.  He would like to go back on preventative medication.  He reports that he had a wisdom tooth removed today.  He returns today for evaluation.   HISTORY 11/30/17 Timothy [name] is a 32 year old male with a history of migraine headaches.  He returns today for follow-up.  At the last visit we increase Topamax to 100 mg at bedtime.  He reports that it was beneficial for his headaches but he began to notice problems with his memory.  He states that he stopped taking Topamax.  He reports that he is done well.  In the last month he is only had 4 migraines.  He reports that they typically resolve with Zomig however he may have 1-2 headaches that may last greater than 24 hours.  With this headaches they always occur in the left temporal region.  He does have photophobia and phonophobia but denies nausea and vomiting he returns today for an evaluation.   REVIEW OF SYSTEMS: Out of a complete 14 system review of symptoms, the patient complains only of the following symptoms, and all other reviewed systems are negative.  See HPI  ALLERGIES: No Known Allergies  HOME MEDICATIONS: Outpatient Medications Prior to Visit  Medication Sig Dispense Refill  . methocarbamol (ROBAXIN) 500 MG tablet Take 500 mg by mouth every 6 (six) hours as needed for muscle spasms.    Marland Kitchen  omeprazole (PRILOSEC) 20 MG capsule Take 20 mg by mouth daily.    Marland Kitchen topiramate (TOPAMAX) 25 MG tablet Take 4 tablets (100 mg total) by mouth at bedtime. (Patient not taking: Reported on 11/30/2017) 360 tablet 3  . zolmitriptan (ZOMIG) 5 MG nasal solution Place 1 spray into the nose 2 (two) times daily as needed for migraine. 6 Units 3   No facility-administered medications prior to visit.     PAST MEDICAL HISTORY: Past Medical History:  Diagnosis Date  . Common migraine with intractable migraine 02/12/2017  . Heart burn   . Migraines     PAST SURGICAL HISTORY: Past Surgical History:  Procedure Laterality Date  . KNEE ARTHROSCOPY     right and left knee x2    FAMILY HISTORY: Family History  Problem Relation Age of Onset  . Diabetes Mother   . Prostate cancer Father     SOCIAL HISTORY: Social History   Socioeconomic History  . Marital status: Single    Spouse name: Not on file  . Number of children: Not on file  . Years of education: Not on file  . Highest education level: Not on file  Occupational History  . Not on file  Social Needs  . Financial resource strain: Not on file  . Food insecurity:    Worry: Not on file    Inability: Not on file  . Transportation needs:    Medical: Not on file  Non-medical: Not on file  Tobacco Use  . Smoking status: Former Smoker    Types: Cigars    Last attempt to quit: 05/10/2017    Years since quitting: 1.0  . Smokeless tobacco: Never Used  Substance and Sexual Activity  . Alcohol use: Yes    Comment: Drinks once per week  . Drug use: Yes    Types: Marijuana    Comment: 3-4 times per month  . Sexual activity: Not on file  Lifestyle  . Physical activity:    Days per week: Not on file    Minutes per session: Not on file  . Stress: Not on file  Relationships  . Social connections:    Talks on phone: Not on file    Gets together: Not on file    Attends religious service: Not on file    Active member of club or  organization: Not on file    Attends meetings of clubs or organizations: Not on file    Relationship status: Not on file  . Intimate partner violence:    Fear of current or ex partner: Not on file    Emotionally abused: Not on file    Physically abused: Not on file    Forced sexual activity: Not on file  Other Topics Concern  . Not on file  Social History Narrative   Lives alone   Right-handed   Caffeine use:  Tea sometimes      PHYSICAL EXAM  Vitals:   06/03/18 1302  BP: 128/83  Pulse: 73  Weight: 261 lb 6.4 oz (118.6 kg)  Height: 6' (1.829 m)   Body mass index is 35.45 kg/m.  Generalized: Well developed, in no acute distress   Neurological examination  Mentation: Alert oriented to time, place, history taking. Follows all commands speech and language fluent Cranial nerve II-XII: Pupils were equal round reactive to light. Extraocular movements were full, visual field were full on confrontational test. Facial sensation and strength were normal. Uvula tongue midline. Head turning and shoulder shrug  were normal and symmetric. Motor: The motor testing reveals 5 over 5 strength of all 4 extremities. Good symmetric motor tone is noted throughout.  Sensory: Sensory testing is intact to soft touch on all 4 extremities. No evidence of extinction is noted.  Coordination: Cerebellar testing reveals good finger-nose-finger and heel-to-shin bilaterally.  Gait and station: Gait is normal. Reflexes: Deep tendon reflexes are symmetric and normal bilaterally.   DIAGNOSTIC DATA (LABS, IMAGING, TESTING) - I reviewed patient records, labs, notes, testing and imaging myself where available.   ASSESSMENT AND PLAN 32 y.o. year old male  has a past medical history of Common migraine with intractable migraine (02/12/2017), Heart burn, and Migraines. here with:  1.  Migraine headaches  The patient would like to be started on a preventative medication.  He will be started on Zonegran taking 25  mg at bedtime for 1 week then increasing to 50 mg at bedtime thereafter.  I have reviewed potential side effects with the patient and provided him with a handout.  He can continue using Zomig for acute therapy.Marland Kitchen.  He is advised that if his symptoms worsen or he develops new symptoms he should let us know.  He will follow-up in 6 months or sooner if needed.    Butch PennyMegan Markeia Harkless, MSN, NP-C 06/03/2018, 11:35 AM [facility] [address], [address] [place], [place] [address] [phone]

## 2018-06-04 DIAGNOSIS — Z0289 Encounter for other administrative examinations: Secondary | ICD-10-CM

## 2018-06-09 ENCOUNTER — Telehealth: Payer: Self-pay | Admitting: *Deleted

## 2018-06-09 NOTE — Telephone Encounter (Signed)
On 06/03/18 this RN received [employer] FML[employer] forms. On 06/09/18 FMLA forms completed, placed on NP's desk. NP will return to office on 06/14/18.

## 2018-06-17 NOTE — Telephone Encounter (Addendum)
Per Dolores HooseM Millikan, NP, LVM for patient advising him a copy of his FMLA papers are ready. However, she will not approve up to 40 hours a month. She did approve frequency of 5-6 episodes a month, duration 1-2 hours per episode.  At his last office visit he reported 1-2 headaches a week, can last up to 2 days. Left office number for any questions.

## 2018-06-18 ENCOUNTER — Telehealth: Payer: Self-pay | Admitting: *Deleted

## 2018-06-18 NOTE — Telephone Encounter (Signed)
I called LVM pt FMLA form ready for p/u @ the front desk

## 2018-06-21 ENCOUNTER — Telehealth: Payer: Self-pay | Admitting: Adult Health

## 2018-06-21 NOTE — Telephone Encounter (Signed)
Error

## 2018-06-21 NOTE — Telephone Encounter (Signed)
Pt came by to pick up FMLA.  I spoke to him about his for.  Per pt, form 05-19-18 last certification ended.  He has been out 7/12 2:25, 7/22 4, 7/23  7, 7/25 1:23  Total 14:48 hours.   Per MM/NP ok to addend.   This addended and signed.  Place up from for pt to pick up.  (no additonal information added or changed).  The total hours to be out can be calculated per HR.  (no need for us to add this).  Pt informed by VM concerning this.  He can pick up at his convenience.

## 2018-07-26 ENCOUNTER — Other Ambulatory Visit: Payer: Self-pay | Admitting: Adult Health

## 2018-12-14 ENCOUNTER — Encounter: Payer: Self-pay | Admitting: Adult Health

## 2018-12-14 ENCOUNTER — Ambulatory Visit (INDEPENDENT_AMBULATORY_CARE_PROVIDER_SITE_OTHER): Payer: 59 | Admitting: Adult Health

## 2018-12-14 VITALS — BP 129/76 | HR 69 | Ht 72.0 in | Wt 268.2 lb

## 2018-12-14 DIAGNOSIS — G43019 Migraine without aura, intractable, without status migrainosus: Secondary | ICD-10-CM

## 2018-12-14 MED ORDER — ZONISAMIDE 25 MG PO CAPS: 75.0000 mg | ORAL_CAPSULE | Freq: Every day | ORAL | 3 refills | Status: DC

## 2018-12-14 NOTE — Progress Notes (Signed)
PATIENT: Timothy [name] DOB: 02/28/1986  REASON FOR VISIT: follow up HISTORY FROM: patient  HISTORY OF PRESENT ILLNESS: Today 12/14/18  Timothy [name] is a 33 year old male who presents for routine follow-up for chronic migraine.  He reports that his headaches are tolerable and have better since his last visit in July 2019.  He is currently taking Zonegran 50 mg at bedtime.  He has stopped the Topamax and he is using Zomig for acute treatment.  He reports having to use the Zomig maybe once a week.  He reports this helps and about an hour later his headache is much better.  He feels like over the last few months his headaches have increased related to stress from his job.  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  He reports that he feels like the stress with his job will be more manageable in the future.  He reports his headaches when they occur are in his left frontal region with photophobia and some nausea.  He also is requesting to have FMLA papers filled out.  He reports overall the Zonegran has been helpful for his headaches.  He presents today for follow-up.   HISTORY Timothy [name] is a 33 year old male with a history of migraine headaches.  He returns today for follow-up.  He is currently only using Zomig for acute therapy.  He reports that his headache frequency has began to increase.  He is having at least one headache a week that can sometimes last 1 to 2 days.  He reports that they typically occur on the right side.  He has noticed blurred vision, photophobia but denies phonophobia, nausea and vomiting.  He reports that Zomig does offer him some benefit.  He would like to go back on preventative medication.  He reports that he had a wisdom tooth removed today.  He returns today for evaluation  REVIEW OF SYSTEMS: Out of a complete 14 system review of symptoms, the patient complains only of the following symptoms, and all other reviewed systems are negative.  Light  sensitivity, dizziness, headache  ALLERGIES: Allergies  Allergen Reactions  . Topamax [Topiramate] Other (See Comments)    Forgetfulness, losing my train of thought    HOME MEDICATIONS: Outpatient Medications Prior to Visit  Medication Sig Dispense Refill  . omeprazole (PRILOSEC) 20 MG capsule Take 20 mg by mouth daily.    Marland Kitchen. topiramate (TOPAMAX) 25 MG tablet Take 4 tablets (100 mg total) by mouth at bedtime. 360 tablet 3  . zolmitriptan (ZOMIG) 5 MG nasal solution Place 1 spray into the nose 2 (two) times daily as needed for migraine. 6 Units 3  . zonisamide (ZONEGRAN) 25 MG capsule Take 2 capsules (50 mg total) by mouth at bedtime. 180 capsule 3  . cyclobenzaprine (FLEXERIL) 10 MG tablet 10 mg as needed.    Marland Kitchen. HYDROcodone-acetaminophen (NORCO) 7.5-325 MG tablet as needed.    . naproxen (NAPROSYN) 500 MG tablet      No facility-administered medications prior to visit.     PAST MEDICAL HISTORY: Past Medical History:  Diagnosis Date  . Common migraine with intractable migraine 02/12/2017  . Heart burn   . Migraines     PAST SURGICAL HISTORY: Past Surgical History:  Procedure Laterality Date  . KNEE ARTHROSCOPY     right and left knee x2  . WISDOM TOOTH EXTRACTION      FAMILY HISTORY: Family History  Problem Relation Age of Onset  .  Diabetes Mother   . Prostate cancer Father     SOCIAL HISTORY: Social History   Socioeconomic History  . Marital status: Single    Spouse name: Not on file  . Number of children: Not on file  . Years of education: Not on file  . Highest education level: Not on file  Occupational History  . Not on file  Social Needs  . Financial resource strain: Not on file  . Food insecurity:    Worry: Not on file    Inability: Not on file  . Transportation needs:    Medical: Not on file    Non-medical: Not on file  Tobacco Use  . Smoking status: Former Smoker    Types: Cigars    Last attempt to quit: 05/10/2017    Years since quitting: 1.5  .  Smokeless tobacco: Never Used  Substance and Sexual Activity  . Alcohol use: Yes    Comment: Drinks once per week  . Drug use: Yes    Types: Marijuana    Comment: 3-4 times per month  . Sexual activity: Not on file  Lifestyle  . Physical activity:    Days per week: Not on file    Minutes per session: Not on file  . Stress: Not on file  Relationships  . Social connections:    Talks on phone: Not on file    Gets together: Not on file    Attends religious service: Not on file    Active member of club or organization: Not on file    Attends meetings of clubs or organizations: Not on file    Relationship status: Not on file  . Intimate partner violence:    Fear of current or ex partner: Not on file    Emotionally abused: Not on file    Physically abused: Not on file    Forced sexual activity: Not on file  Other Topics Concern  . Not on file  Social History Narrative   Lives alone   Right-handed   Caffeine use:  Tea sometimes      PHYSICAL EXAM  Vitals:   12/14/18 1323  BP: 129/76  Pulse: 69  Weight: 268 lb 3.2 oz (121.7 kg)  Height: 6' (1.829 m)   Body mass index is 36.37 kg/m.  Generalized: Well developed, in no acute distress   Neurological examination  Mentation: Alert oriented to time, place, history taking. Follows all commands speech and language fluent Cranial nerve II-XII: Pupils were equal round reactive to light. Extraocular movements were full, visual field were full on confrontational test. Facial sensation and strength were normal. Uvula tongue midline. Head turning and shoulder shrug  were normal and symmetric. Motor: The motor testing reveals 5 over 5 strength of all 4 extremities. Good symmetric motor tone is noted throughout.  Sensory: Sensory testing is intact to soft touch on all 4 extremities. No evidence of extinction is noted.  Coordination: Cerebellar testing reveals good finger-nose-finger and heel-to-shin bilaterally.  Gait and station: Gait  is normal. Tandem gait is normal. Romberg is negative. No drift is seen.  Reflexes: Deep tendon reflexes are symmetric and normal bilaterally.   DIAGNOSTIC DATA (LABS, IMAGING, TESTING) - I reviewed patient records, labs, notes, testing and imaging myself where available.  No results found for: WBC, HGB, HCT, MCV, PLT No results found for: NA, K, CL, CO2, GLUCOSE, BUN, CREATININE, CALCIUM, PROT, ALBUMIN, AST, ALT, ALKPHOS, BILITOT, GFRNONAA, GFRAA No results found for: CHOL, HDL, LDLCALC, LDLDIRECT, TRIG, CHOLHDL  No results found for: HGBA1C No results found for: VITAMINB12 No results found for: TSH    ASSESSMENT AND PLAN 33 y.o. year old male  has a past medical history of Common migraine with intractable migraine (02/12/2017), Heart burn, and Migraines. here with:  1. Migraine  Overall he reports that his headaches are better.  However he reports having 1-2 headaches per week.  We will increase his Zonegran to 75 mg at bedtime.  He will continue to use the Zomig for acute treatment.  He will leave his FMLA papers at the desk and we will fill them out accordingly.  He is advised to call us or let us know if he has any questions or any problems.  He will follow-up in 6 months or sooner if needed.   I spent 15 minutes with the patient. 50% of this time was spent discussing his plan of care.    Margie EgeSarah Analese Sovine, AGNP-C, DNP 12/14/2018, 1:46 PM [facility] [address], [address] [place], [place] [phone]

## 2018-12-14 NOTE — Progress Notes (Signed)
I have read the note, and I agree with the clinical assessment and plan.  Tanganika Barradas K Kaevon Cotta   

## 2018-12-16 ENCOUNTER — Telehealth: Payer: Self-pay | Admitting: *Deleted

## 2018-12-16 NOTE — Telephone Encounter (Signed)
FMLA p/w completed.  To Dr. Anne HahnWillis for review and signature.

## 2018-12-17 NOTE — Telephone Encounter (Signed)
Dr. Anne Hahn completed and signed FMLA form. Given to Stanton Kidney in MR.

## 2019-07-06 ENCOUNTER — Encounter: Payer: Self-pay | Admitting: Adult Health

## 2019-07-06 ENCOUNTER — Ambulatory Visit (INDEPENDENT_AMBULATORY_CARE_PROVIDER_SITE_OTHER): Payer: 59 | Admitting: Adult Health

## 2019-07-06 ENCOUNTER — Other Ambulatory Visit: Payer: Self-pay

## 2019-07-06 VITALS — BP 128/82 | HR 78 | Temp 97.1°F | Ht 73.0 in | Wt 275.8 lb

## 2019-07-06 DIAGNOSIS — G43009 Migraine without aura, not intractable, without status migrainosus: Secondary | ICD-10-CM | POA: Diagnosis not present

## 2019-07-06 NOTE — Progress Notes (Signed)
PATIENT: Timothy [name] DOB: 1986-10-08  REASON FOR VISIT: follow up HISTORY FROM: patient  HISTORY OF PRESENT ILLNESS: Today 07/06/19 :  Timothy [name] is a 33 year old male with a history of chronic migraine.  He returns today for follow-up.  He remains on Zonegran 75 mg at bedtime.  He does report that he is not taking the medication for approximately 1 month because he simply forgot to pick up the prescription.  He states that he started back last night.  He reports that he has approximately 1-2 headaches a week.  He reports that the headache typically occurs in the left temporal region.  He does report photophobia and phonophobia.  He states occasionally he will have nausea but no vomiting.  He states that if this is severe headache he will use Zomig with good benefit.  He denies any new issues.  He returns today for an evaluation.  HISTORY 12/14/18  Timothy [name] is a 33 year old male who presents for routine follow-up for chronic migraine.  He reports that his headaches are tolerable and have better since his last visit in July 2019.  He is currently taking Zonegran 50 mg at bedtime.  He has stopped the Topamax and he is using Zomig for acute treatment.  He reports having to use the Zomig maybe once a week.  He reports this helps and about an hour later his headache is much better.  He feels like over the last few months his headaches have increased related to stress from his job.  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  He reports that he feels like the stress with his job will be more manageable in the future.  He reports his headaches when they occur are in his left frontal region with photophobia and some nausea.  He also is requesting to have FMLA papers filled out.  He reports overall the Zonegran has been helpful for his headaches.  He presents today for follow-up.  REVIEW OF SYSTEMS: Out of a complete 14 system review of symptoms, the patient complains only of  the following symptoms, and all other reviewed systems are negative.  See HPI  ALLERGIES: Allergies  Allergen Reactions  . Topamax [Topiramate] Other (See Comments)    Forgetfulness, losing my train of thought    HOME MEDICATIONS: Outpatient Medications Prior to Visit  Medication Sig Dispense Refill  . naproxen (NAPROSYN) 500 MG tablet     . omeprazole (PRILOSEC) 20 MG capsule Take 20 mg by mouth daily.    Marland Kitchen topiramate (TOPAMAX) 25 MG tablet Take 4 tablets (100 mg total) by mouth at bedtime. 360 tablet 3  . zolmitriptan (ZOMIG) 5 MG nasal solution Place 1 spray into the nose 2 (two) times daily as needed for migraine. 6 Units 3  . zonisamide (ZONEGRAN) 25 MG capsule Take 3 capsules (75 mg total) by mouth at bedtime. 270 capsule 3  . cyclobenzaprine (FLEXERIL) 10 MG tablet 10 mg as needed.    Marland Kitchen HYDROcodone-acetaminophen (NORCO) 7.5-325 MG tablet as needed.     No facility-administered medications prior to visit.     PAST MEDICAL HISTORY: Past Medical History:  Diagnosis Date  . Common migraine with intractable migraine 02/12/2017  . Heart burn   . Migraines     PAST SURGICAL HISTORY: Past Surgical History:  Procedure Laterality Date  . KNEE ARTHROSCOPY     right and left knee x2  . WISDOM TOOTH EXTRACTION  FAMILY HISTORY: Family History  Problem Relation Age of Onset  . Diabetes Mother   . Prostate cancer Father     SOCIAL HISTORY: Social History   Socioeconomic History  . Marital status: Single    Spouse name: Not on file  . Number of children: Not on file  . Years of education: Not on file  . Highest education level: Not on file  Occupational History  . Not on file  Social Needs  . Financial resource strain: Not on file  . Food insecurity    Worry: Not on file    Inability: Not on file  . Transportation needs    Medical: Not on file    Non-medical: Not on file  Tobacco Use  . Smoking status: Former Smoker    Types: Cigars    Quit date: 05/10/2017     Years since quitting: 2.1  . Smokeless tobacco: Never Used  Substance and Sexual Activity  . Alcohol use: Yes    Comment: Drinks once per week  . Drug use: Yes    Types: Marijuana    Comment: 3-4 times per month  . Sexual activity: Not on file  Lifestyle  . Physical activity    Days per week: Not on file    Minutes per session: Not on file  . Stress: Not on file  Relationships  . Social Herbalist on phone: Not on file    Gets together: Not on file    Attends religious service: Not on file    Active member of club or organization: Not on file    Attends meetings of clubs or organizations: Not on file    Relationship status: Not on file  . Intimate partner violence    Fear of current or ex partner: Not on file    Emotionally abused: Not on file    Physically abused: Not on file    Forced sexual activity: Not on file  Other Topics Concern  . Not on file  Social History Narrative   Lives alone   Right-handed   Caffeine use:  Tea sometimes      PHYSICAL EXAM  Vitals:   07/06/19 1443  BP: 128/82  Pulse: 78  Temp: (!) 97.1 F (36.2 C)  Weight: 275 lb 12.8 oz (125.1 kg)  Height: 6\' 1"  (1.854 m)   Body mass index is 36.39 kg/m.  Generalized: Well developed, in no acute distress   Neurological examination  Mentation: Alert oriented to time, place, history taking. Follows all commands speech and language fluent Cranial nerve II-XII: Pupils were equal round reactive to light. Extraocular movements were full, visual field were full on confrontational test.  Head turning and shoulder shrug  were normal and symmetric. Motor: The motor testing reveals 5 over 5 strength of all 4 extremities. Good symmetric motor tone is noted throughout.  Sensory: Sensory testing is intact to soft touch on all 4 extremities. No evidence of extinction is noted.  Coordination: Cerebellar testing reveals good finger-nose-finger and heel-to-shin bilaterally.  Gait and station:  Gait is normal. Reflexes: Deep tendon reflexes are symmetric and normal bilaterally.   DIAGNOSTIC DATA (LABS, IMAGING, TESTING) - I reviewed patient records, labs, notes, testing and imaging myself where available.    ASSESSMENT AND PLAN 33 y.o. year old male  has a past medical history of Common migraine with intractable migraine (02/12/2017), Heart burn, and Migraines. here with:  1.  Migraine headache  The patient has restarted Zonegran.  I advised that after has been on that dose for 2 weeks to call and we will increase him to 100 mg daily.  Hopefully this will further reduce his headache frequency.  He will continue on Zomig if needed.  He is advised that if his symptoms worsen or he develops new symptoms he should let us know.  He will follow-up in 6 months or sooner if needed.   I spent 15 minutes with the patient. 50% of this time was spent discussing plan of care   Butch PennyMegan Myra Weng, MSN, NP-C 07/06/2019, 2:49 PM [facility] [address], [address] [place], [place] [address] [phone]

## 2019-07-06 NOTE — Patient Instructions (Signed)
Your Plan:  Continue zonegran- once you have been on the dose for two weeks call and we will increase to 100 mg Continue Zomig If your symptoms worsen or you develop new symptoms please let us know.   Thank you for coming to see [place] at [facility]. I hope we have been able to provide you high quality care today.  You may receive a patient satisfaction survey over the next few weeks. We would appreciate your feedback and comments so that we may continue to improve ourselves and the health of our patients.

## 2019-07-06 NOTE — Progress Notes (Signed)
I have read the note, and I agree with the clinical assessment and plan.  Broxton Broady K Defne Gerling   

## 2019-09-15 ENCOUNTER — Other Ambulatory Visit: Payer: Self-pay

## 2019-09-15 DIAGNOSIS — Z20822 Contact with and (suspected) exposure to covid-19: Secondary | ICD-10-CM

## 2019-09-17 LAB — NOVEL CORONAVIRUS, NAA: SARS-CoV-2, NAA: NOT DETECTED

## 2019-11-16 ENCOUNTER — Ambulatory Visit: Payer: BC Managed Care – PPO | Attending: Internal Medicine

## 2019-11-16 DIAGNOSIS — Z20822 Contact with and (suspected) exposure to covid-19: Secondary | ICD-10-CM

## 2019-11-17 LAB — NOVEL CORONAVIRUS, NAA: SARS-CoV-2, NAA: NOT DETECTED

## 2020-01-06 ENCOUNTER — Other Ambulatory Visit: Payer: Self-pay | Admitting: Neurology

## 2020-01-09 ENCOUNTER — Telehealth: Payer: Self-pay

## 2020-01-09 ENCOUNTER — Ambulatory Visit: Payer: 59 | Admitting: Adult Health

## 2020-01-09 NOTE — Telephone Encounter (Signed)
Patient was a no call/no show for their appointment today.   

## 2020-01-10 ENCOUNTER — Ambulatory Visit (INDEPENDENT_AMBULATORY_CARE_PROVIDER_SITE_OTHER): Payer: BC Managed Care – PPO | Admitting: Adult Health

## 2020-01-10 ENCOUNTER — Other Ambulatory Visit: Payer: Self-pay

## 2020-01-10 ENCOUNTER — Telehealth: Payer: Self-pay | Admitting: Adult Health

## 2020-01-10 ENCOUNTER — Encounter: Payer: Self-pay | Admitting: Adult Health

## 2020-01-10 VITALS — BP 138/66 | HR 66 | Temp 96.9°F | Ht 73.0 in | Wt 270.2 lb

## 2020-01-10 DIAGNOSIS — G43009 Migraine without aura, not intractable, without status migrainosus: Secondary | ICD-10-CM | POA: Diagnosis not present

## 2020-01-10 MED ORDER — ZONISAMIDE 100 MG PO CAPS: 100.0000 mg | ORAL_CAPSULE | Freq: Every day | ORAL | 3 refills | Status: DC

## 2020-01-10 NOTE — Progress Notes (Signed)
I have read the note, and I agree with the clinical assessment and plan.  Cecilia Nishikawa K Leaha Cuervo   

## 2020-01-10 NOTE — Telephone Encounter (Signed)
I received FMLA paperwork from [employer] brought by patient at appointment today. I collected $50 form fee. Form given to medical records to distribute.

## 2020-01-10 NOTE — Telephone Encounter (Signed)
[employer] FMLA papers on NP's desk for review, completion and signature.

## 2020-01-10 NOTE — Patient Instructions (Addendum)
Your Plan:  Increase Zonegran to 100 mg at bedtime Continue zomig for acute therapy for migraine Keep headache journal If your symptoms worsen or you develop new symptoms please let us know.   Thank you for coming to see [place] at [facility]. I hope we have been able to provide you high quality care today.  You may receive a patient satisfaction survey over the next few weeks. We would appreciate your feedback and comments so that we may continue to improve ourselves and the health of our patients.

## 2020-01-10 NOTE — Progress Notes (Signed)
PATIENT: Timothy [name] DOB: Jul 05, 1986  REASON FOR VISIT: follow up HISTORY FROM: patient  HISTORY OF PRESENT ILLNESS: Today 01/10/20:  Timothy [name] is a 34 year old male with a history of migraine headaches.  He returns today for follow-up.  He reports in the last 2 to 3 months his headaches have increased.  Reports that he is having 12-15 headaches a month.  They typically occur in the left frontal region.  He reports nausea but no vomiting.  On occasion he does have photophobia and phonophobia.  Reports that he uses Zomig with good benefit.  He states that his increase in stress has contributed to his increase in headaches.  He returns today for an evaluation  HISTORY 07/06/19 :  Timothy [name] is a 34 year old male with a history of chronic migraine.  He returns today for follow-up.  He remains on Zonegran 75 mg at bedtime.  He does report that he is not taking the medication for approximately 1 month because he simply forgot to pick up the prescription.  He states that he started back last night.  He reports that he has approximately 1-2 headaches a week.  He reports that the headache typically occurs in the left temporal region.  He does report photophobia and phonophobia.  He states occasionally he will have nausea but no vomiting.  He states that if this is severe headache he will use Zomig with good benefit.  He denies any new issues.  He returns today for an evaluation.  REVIEW OF SYSTEMS: Out of a complete 14 system review of symptoms, the patient complains only of the following symptoms, and all other reviewed systems are negative.  See HPI  ALLERGIES: Allergies  Allergen Reactions  . Topamax [Topiramate] Other (See Comments)    Forgetfulness, losing my train of thought    HOME MEDICATIONS: Outpatient Medications Prior to Visit  Medication Sig Dispense Refill  . Multiple Vitamins-Minerals (MULTIVITAMIN ADULT EXTRA C) CHEW Chew by mouth. OTC    . omeprazole (PRILOSEC) 20 MG  capsule Take 20 mg by mouth daily.    Marland Kitchen zolmitriptan (ZOMIG) 5 MG nasal solution Place 1 spray into the nose 2 (two) times daily as needed for migraine. 6 Units 3  . zonisamide (ZONEGRAN) 25 MG capsule Take 3 capsules (75 mg total) by mouth at bedtime. 270 capsule 3  . naproxen (NAPROSYN) 500 MG tablet      No facility-administered medications prior to visit.    PAST MEDICAL HISTORY: Past Medical History:  Diagnosis Date  . Common migraine with intractable migraine 02/12/2017  . Heart burn   . Migraines     PAST SURGICAL HISTORY: Past Surgical History:  Procedure Laterality Date  . KNEE ARTHROSCOPY     right and left knee x2  . WISDOM TOOTH EXTRACTION      FAMILY HISTORY: Family History  Problem Relation Age of Onset  . Diabetes Mother   . Prostate cancer Father     SOCIAL HISTORY: Social History   Socioeconomic History  . Marital status: Single    Spouse name: Not on file  . Number of children: Not on file  . Years of education: Not on file  . Highest education level: Not on file  Occupational History  . Not on file  Tobacco Use  . Smoking status: Former Smoker    Types: Cigars    Quit date: 05/10/2017    Years since quitting: 2.6  . Smokeless tobacco: Never Used  Substance and Sexual Activity  .  Alcohol use: Yes    Comment: Drinks once per week  . Drug use: Yes    Types: Marijuana    Comment: 3-4 times per month  . Sexual activity: Not on file  Other Topics Concern  . Not on file  Social History Narrative   Lives alone   Right-handed   Caffeine use:  Tea sometimes   Social Determinants of Health   Financial Resource Strain:   . Difficulty of Paying Living Expenses: Not on file  Food Insecurity:   . Worried About Charity fundraiser in the Last Year: Not on file  . Ran Out of Food in the Last Year: Not on file  Transportation Needs:   . Lack of Transportation (Medical): Not on file  . Lack of Transportation (Non-Medical): Not on file  Physical  Activity:   . Days of Exercise per Week: Not on file  . Minutes of Exercise per Session: Not on file  Stress:   . Feeling of Stress : Not on file  Social Connections:   . Frequency of Communication with Friends and Family: Not on file  . Frequency of Social Gatherings with Friends and Family: Not on file  . Attends Religious Services: Not on file  . Active Member of Clubs or Organizations: Not on file  . Attends Archivist Meetings: Not on file  . Marital Status: Not on file  Intimate Partner Violence:   . Fear of Current or Ex-Partner: Not on file  . Emotionally Abused: Not on file  . Physically Abused: Not on file  . Sexually Abused: Not on file      PHYSICAL EXAM  Vitals:   01/10/20 0828  BP: 138/66  Pulse: 66  Temp: (!) 96.9 F (36.1 C)  Weight: 270 lb 3.2 oz (122.6 kg)  Height: 6\' 1"  (1.854 m)   Body mass index is 35.65 kg/m.  Generalized: Well developed, in no acute distress   Neurological examination  Mentation: Alert oriented to time, place, history taking. Follows all commands speech and language fluent Cranial nerve II-XII: Pupils were equal round reactive to light. Extraocular movements were full, visual field were full on confrontational test.  Head turning and shoulder shrug  were normal and symmetric. Motor: The motor testing reveals 5 over 5 strength of all 4 extremities. Good symmetric motor tone is noted throughout.  Sensory: Sensory testing is intact to soft touch on all 4 extremities. No evidence of extinction is noted.  Coordination: Cerebellar testing reveals good finger-nose-finger and heel-to-shin bilaterally.  Gait and station: Gait is normal.  Reflexes: Deep tendon reflexes are symmetric and normal bilaterally.   DIAGNOSTIC DATA (LABS, IMAGING, TESTING) - I reviewed patient records, labs, notes, testing and imaging myself where available.     ASSESSMENT AND PLAN 34 y.o. year old male  has a past medical history of Common migraine  with intractable migraine (02/12/2017), Heart burn, and Migraines. here with:  1.  Migraine headache  -Increase Zonegran to 100 milligrams at bedtime -Continue Zomig as an abortive therapy -Advised to keep a headache journal -If frequency of headaches do not improve he should let us know  If symptoms worsen or he develops new symptoms he should let us know.  Follow-up in 6 months  I spent 15 minutes with the patient.  This time was spent reviewing the chart and discussing plan of care   Ward Givens, MSN, NP-C 01/10/2020, 9:02 AM [facility] [address], [place], [place] [address] [phone]   

## 2020-05-31 ENCOUNTER — Ambulatory Visit: Payer: BC Managed Care – PPO | Attending: Internal Medicine

## 2020-05-31 DIAGNOSIS — Z23 Encounter for immunization: Secondary | ICD-10-CM

## 2020-05-31 NOTE — Progress Notes (Signed)
   Covid-19 Vaccination Clinic  Name:  Timothy [name]    MRN: [id number] DOB: 07/08/1986  05/31/2020  Mr. Timothy [name] was observed post Covid-19 immunization for 15 minutes without incident. He was provided with Vaccine Information Sheet and instruction to access the V-Safe system.   Mr. Timothy [name] was instructed to call 911 with any severe reactions post vaccine: Marland Kitchen Difficulty breathing  . Swelling of face and throat  . A fast heartbeat  . A bad rash all over body  . Dizziness and weakness   Immunizations Administered    Name Date Dose VIS Date Route   Pfizer COVID-19 Vaccine 05/31/2020  2:29 PM 0.3 mL 01/04/2019 Intramuscular   Manufacturer: [employer], [employer]   Lot: [id number]   NDC: [address]-6

## 2020-06-26 ENCOUNTER — Ambulatory Visit: Payer: BC Managed Care – PPO | Attending: Internal Medicine

## 2020-06-26 DIAGNOSIS — Z23 Encounter for immunization: Secondary | ICD-10-CM

## 2020-06-26 NOTE — Progress Notes (Signed)
   Covid-19 Vaccination Clinic  Name:  Timothy [name]    MRN: [id number] DOB: 02/21/1986  06/26/2020  Mr. Timothy [name] was observed post Covid-19 immunization for 15 minutes without incident. He was provided with Vaccine Information Sheet and instruction to access the V-Safe system.   Mr. Timothy [name] was instructed to call 911 with any severe reactions post vaccine: Marland Kitchen Difficulty breathing  . Swelling of face and throat  . A fast heartbeat  . A bad rash all over body  . Dizziness and weakness   Immunizations Administered    Name Date Dose VIS Date Route   Pfizer COVID-19 Vaccine 06/26/2020  2:31 PM 0.3 mL 01/04/2019 Intramuscular   Manufacturer: [employer], [employer]   Lot: [id number]   NDC: [address]-3

## 2020-07-12 ENCOUNTER — Ambulatory Visit (INDEPENDENT_AMBULATORY_CARE_PROVIDER_SITE_OTHER): Payer: BC Managed Care – PPO | Admitting: Adult Health

## 2020-07-12 ENCOUNTER — Other Ambulatory Visit: Payer: Self-pay

## 2020-07-12 ENCOUNTER — Encounter: Payer: Self-pay | Admitting: Adult Health

## 2020-07-12 VITALS — BP 115/67 | HR 62 | Ht 73.0 in | Wt 272.0 lb

## 2020-07-12 DIAGNOSIS — G43009 Migraine without aura, not intractable, without status migrainosus: Secondary | ICD-10-CM | POA: Diagnosis not present

## 2020-07-12 MED ORDER — ZONISAMIDE 25 MG PO CAPS: 25.0000 mg | ORAL_CAPSULE | Freq: Every day | ORAL | 3 refills | Status: DC

## 2020-07-12 NOTE — Progress Notes (Signed)
PATIENT: Timothy [name] DOB: Jul 12, 1986  REASON FOR VISIT: follow up HISTORY FROM: patient  HISTORY OF PRESENT ILLNESS: Today 07/12/20:  Timothy [name] is a 34 year old male with a history of migraine headaches.  He returns today for follow-up.  He reports that he has approximately 2 headaches a week.  He reports that initially he will try to get in a quiet room and use peppermint oil.  He states that this works 50% of the time.  He states if he does not get any benefit then he takes Zomig and his headache will resolve within an hour.  He returns today for an evaluation.  HISTORY 01/10/20:  Timothy [name] is a 34 year old male with a history of migraine headaches.  He returns today for follow-up.  He reports in the last 2 to 3 months his headaches have increased.  Reports that he is having 12-15 headaches a month.  They typically occur in the left frontal region.  He reports nausea but no vomiting.  On occasion he does have photophobia and phonophobia.  Reports that he uses Zomig with good benefit.  He states that his increase in stress has contributed to his increase in headaches.  He returns today for an evaluation  REVIEW OF SYSTEMS: Out of a complete 14 system review of symptoms, the patient complains only of the following symptoms, and all other reviewed systems are negative.  See HPI  ALLERGIES: Allergies  Allergen Reactions  . Topamax [Topiramate] Other (See Comments)    Forgetfulness, losing my train of thought    HOME MEDICATIONS: Outpatient Medications Prior to Visit  Medication Sig Dispense Refill  . Multiple Vitamins-Minerals (MULTIVITAMIN ADULT EXTRA C) CHEW Chew by mouth. OTC    . omeprazole (PRILOSEC) 20 MG capsule Take 20 mg by mouth daily.    Marland Kitchen zolmitriptan (ZOMIG) 5 MG nasal solution Place 1 spray into the nose 2 (two) times daily as needed for migraine. 6 Units 3  . zonisamide (ZONEGRAN) 100 MG capsule Take 1 capsule (100 mg total) by mouth at bedtime. 90 capsule 3    No facility-administered medications prior to visit.    PAST MEDICAL HISTORY: Past Medical History:  Diagnosis Date  . Common migraine with intractable migraine 02/12/2017  . Heart burn   . Migraines     PAST SURGICAL HISTORY: Past Surgical History:  Procedure Laterality Date  . KNEE ARTHROSCOPY     right and left knee x2  . WISDOM TOOTH EXTRACTION      FAMILY HISTORY: Family History  Problem Relation Age of Onset  . Diabetes Mother   . Prostate cancer Father     SOCIAL HISTORY: Social History   Socioeconomic History  . Marital status: Single    Spouse name: Not on file  . Number of children: Not on file  . Years of education: Not on file  . Highest education level: Not on file  Occupational History  . Not on file  Tobacco Use  . Smoking status: Former Smoker    Types: Cigars    Quit date: 05/10/2017    Years since quitting: 3.1  . Smokeless tobacco: Never Used  Substance and Sexual Activity  . Alcohol use: Yes    Comment: Drinks once per week  . Drug use: Yes    Types: Marijuana    Comment: 3-4 times per month  . Sexual activity: Not on file  Other Topics Concern  . Not on file  Social History Narrative   Lives alone  Right-handed   Caffeine use:  Tea sometimes   Social Determinants of Health   Financial Resource Strain:   . Difficulty of Paying Living Expenses: Not on file  Food Insecurity:   . Worried About Programme researcher, broadcasting/film/video in the Last Year: Not on file  . Ran Out of Food in the Last Year: Not on file  Transportation Needs:   . Lack of Transportation (Medical): Not on file  . Lack of Transportation (Non-Medical): Not on file  Physical Activity:   . Days of Exercise per Week: Not on file  . Minutes of Exercise per Session: Not on file  Stress:   . Feeling of Stress : Not on file  Social Connections:   . Frequency of Communication with Friends and Family: Not on file  . Frequency of Social Gatherings with Friends and Family: Not on file   . Attends Religious Services: Not on file  . Active Member of Clubs or Organizations: Not on file  . Attends Banker Meetings: Not on file  . Marital Status: Not on file  Intimate Partner Violence:   . Fear of Current or Ex-Partner: Not on file  . Emotionally Abused: Not on file  . Physically Abused: Not on file  . Sexually Abused: Not on file      PHYSICAL EXAM  Vitals:   07/12/20 1358  BP: 115/67  Pulse: 62  Weight: 272 lb (123.4 kg)  Height: 6\' 1"  (1.854 m)   Body mass index is 35.89 kg/m.  Generalized: Well developed, in no acute distress   Neurological examination  Mentation: Alert oriented to time, place, history taking. Follows all commands speech and language fluent Cranial nerve II-XII: Pupils were equal round reactive to light. Extraocular movements were full, visual field were full on confrontational test.  Head turning and shoulder shrug  were normal and symmetric. Motor: The motor testing reveals 5 over 5 strength of all 4 extremities. Good symmetric motor tone is noted throughout.  Sensory: Sensory testing is intact to soft touch on all 4 extremities. No evidence of extinction is noted.  Coordination: Cerebellar testing reveals good finger-nose-finger and heel-to-shin bilaterally.  Gait and station: Gait is normal.  Reflexes: Deep tendon reflexes are symmetric and normal bilaterally.   DIAGNOSTIC DATA (LABS, IMAGING, TESTING) - I reviewed patient records, labs, notes, testing and imaging myself where available.     ASSESSMENT AND PLAN 34 y.o. year old male  has a past medical history of Common migraine with intractable migraine (02/12/2017), Heart burn, and Migraines. here with:  Migraine headaches   Increase Zonegran to 125 mg at bedtime  Continue Zomig as an abortive therapy  If headaches do not improve he should let 04/14/2017 know  Follow-up in 6 months or sooner if needed   I spent 25 minutes of face-to-face and non-face-to-face time  with patient.  This included previsit chart review, lab review, study review, order entry, electronic health record documentation, patient education.  [place], MSN, NP-C 07/12/2020, 2:09 PM [facility] [address], [address] [place], [place] [phone]

## 2020-07-12 NOTE — Progress Notes (Signed)
I have read the note, and I agree with the clinical assessment and plan.  Adriano Bischof K Lajuane Leatham   

## 2020-07-12 NOTE — Patient Instructions (Signed)
Your Plan:  Increase Zonegran to 125 mg at bedtime Take Zomig at the onset of migraine If your symptoms worsen or you develop new symptoms please let us know.    Thank you for coming to see [place] at [facility]. I hope we have been able to provide you high quality care today.  You may receive a patient satisfaction survey over the next few weeks. We would appreciate your feedback and comments so that we may continue to improve ourselves and the health of our patients.

## 2021-01-16 ENCOUNTER — Ambulatory Visit (INDEPENDENT_AMBULATORY_CARE_PROVIDER_SITE_OTHER): Payer: BC Managed Care – PPO | Admitting: Adult Health

## 2021-01-16 VITALS — BP 139/79 | HR 68 | Ht 73.0 in | Wt 286.0 lb

## 2021-01-16 DIAGNOSIS — G43009 Migraine without aura, not intractable, without status migrainosus: Secondary | ICD-10-CM | POA: Diagnosis not present

## 2021-01-16 NOTE — Progress Notes (Signed)
PATIENT: Timothy [name] DOB: February 21, 1986  REASON FOR VISIT: follow up HISTORY FROM: patient  HISTORY OF PRESENT ILLNESS: Today 01/16/21:  Timothy [name] is a 35 year old male with a history of migraine headaches.  He returns today for follow-up.  At the last visit we increase Zonegran to 125 mg at bedtime.  He reports that he has approximately 2-3 headaches a month.  He states in February he had for headaches but this was due to stress from work.  He states that typically he can take Zomig and his headache will resolve in "a couple of hours."  He returns today for follow-up  07/12/20: Timothy [name] is a 35 year old male with a history of migraine headaches.  He returns today for follow-up.  He reports that he has approximately 2 headaches a week.  He reports that initially he will try to get in a quiet room and use peppermint oil.  He states that this works 50% of the time.  He states if he does not get any benefit then he takes Zomig and his headache will resolve within an hour.  He returns today for an evaluation.  HISTORY 01/10/20:  Timothy [name] is a 35 year old male with a history of migraine headaches.  He returns today for follow-up.  He reports in the last 2 to 3 months his headaches have increased.  Reports that he is having 12-15 headaches a month.  They typically occur in the left frontal region.  He reports nausea but no vomiting.  On occasion he does have photophobia and phonophobia.  Reports that he uses Zomig with good benefit.  He states that his increase in stress has contributed to his increase in headaches.  He returns today for an evaluation  REVIEW OF SYSTEMS: Out of a complete 14 system review of symptoms, the patient complains only of the following symptoms, and all other reviewed systems are negative.  See HPI  ALLERGIES: Allergies  Allergen Reactions  . Topamax [Topiramate] Other (See Comments)    Forgetfulness, losing my train of thought    HOME MEDICATIONS: Outpatient  Medications Prior to Visit  Medication Sig Dispense Refill  . Multiple Vitamins-Minerals (MULTIVITAMIN ADULT EXTRA C) CHEW Chew by mouth. OTC    . omeprazole (PRILOSEC) 20 MG capsule Take 20 mg by mouth daily.    Marland Kitchen zolmitriptan (ZOMIG) 5 MG nasal solution Place 1 spray into the nose 2 (two) times daily as needed for migraine. 6 Units 3  . zonisamide (ZONEGRAN) 100 MG capsule Take 1 capsule (100 mg total) by mouth at bedtime. 90 capsule 3  . zonisamide (ZONEGRAN) 25 MG capsule Take 1 capsule (25 mg total) by mouth daily. 90 capsule 3   No facility-administered medications prior to visit.    PAST MEDICAL HISTORY: Past Medical History:  Diagnosis Date  . Common migraine with intractable migraine 02/12/2017  . Heart burn   . Migraines     PAST SURGICAL HISTORY: Past Surgical History:  Procedure Laterality Date  . KNEE ARTHROSCOPY     right and left knee x2  . WISDOM TOOTH EXTRACTION      FAMILY HISTORY: Family History  Problem Relation Age of Onset  . Diabetes Mother   . Prostate cancer Father     SOCIAL HISTORY: Social History   Socioeconomic History  . Marital status: Single    Spouse name: Not on file  . Number of children: Not on file  . Years of education: Not on file  . Highest education level:  Not on file  Occupational History  . Not on file  Tobacco Use  . Smoking status: Former Smoker    Types: Cigars    Quit date: 05/10/2017    Years since quitting: 3.6  . Smokeless tobacco: Never Used  Substance and Sexual Activity  . Alcohol use: Yes    Comment: Drinks once per week  . Drug use: Yes    Types: Marijuana    Comment: 3-4 times per month  . Sexual activity: Not on file  Other Topics Concern  . Not on file  Social History Narrative   Lives alone   Right-handed   Caffeine use:  Tea sometimes   Social Determinants of Health   Financial Resource Strain: Not on file  Food Insecurity: Not on file  Transportation Needs: Not on file  Physical Activity:  Not on file  Stress: Not on file  Social Connections: Not on file  Intimate Partner Violence: Not on file      PHYSICAL EXAM  Vitals:   01/16/21 1402  Weight: 286 lb (129.7 kg)  Height: 6\' 1"  (1.854 m)   Body mass index is 37.73 kg/m.  Generalized: Well developed, in no acute distress   Neurological examination  Mentation: Alert oriented to time, place, history taking. Follows all commands speech and language fluent Cranial nerve II-XII: Pupils were equal round reactive to light. Extraocular movements were full, visual field were full on confrontational test.  Head turning and shoulder shrug  were normal and symmetric. Motor: The motor testing reveals 5 over 5 strength of all 4 extremities. Good symmetric motor tone is noted throughout.  Sensory: Sensory testing is intact to soft touch on all 4 extremities. No evidence of extinction is noted.  Coordination: Cerebellar testing reveals good finger-nose-finger and heel-to-shin bilaterally.  Gait and station: Gait is normal.  Reflexes: Deep tendon reflexes are symmetric and normal bilaterally.   DIAGNOSTIC DATA (LABS, IMAGING, TESTING) - I reviewed patient records, labs, notes, testing and imaging myself where available.     ASSESSMENT AND PLAN 35 y.o. year old male  has a past medical history of Common migraine with intractable migraine (02/12/2017), Heart burn, and Migraines. here with:  Migraine headaches   Continue  Zonegran to 125 mg at bedtime  Continue Zomig as an abortive therapy  If headaches do not improve he should let 04/14/2017 know  Follow-up in 6 months or sooner if needed   I spent 25 minutes of face-to-face and non-face-to-face time with patient.  This included previsit chart review, lab review, study review, order entry, electronic health record documentation, patient education.  [place], MSN, NP-C 01/16/2021, 2:03 PM [facility] [address] [place], [place] [phone]

## 2021-01-16 NOTE — Patient Instructions (Signed)
Your Plan:  Continue Zonegran  Continue Zomig If your symptoms worsen or you develop new symptoms please let us know.    Thank you for coming to see [place] at [facility]. I hope we have been able to provide you high quality care today.  You may receive a patient satisfaction survey over the next few weeks. We would appreciate your feedback and comments so that we may continue to improve ourselves and the health of our patients.

## 2021-01-20 NOTE — Progress Notes (Signed)
I have read the note, and I agree with the clinical assessment and plan.  Madeleyn Schwimmer K Ying Blankenhorn   

## 2021-01-30 ENCOUNTER — Telehealth: Payer: Self-pay

## 2021-01-30 NOTE — Telephone Encounter (Signed)
FMLA for [employer] has been completed and signed by MM, NP. Copies have been made a filed. Completed form has been sent to medical records for processing.

## 2021-01-31 ENCOUNTER — Telehealth: Payer: Self-pay | Admitting: Adult Health

## 2021-01-31 NOTE — Telephone Encounter (Signed)
Contacted [employer] via telephone to pick up FMLA [employer] paperwork.

## 2021-02-07 NOTE — Telephone Encounter (Signed)
FMLA brought in to office.  Needed to placed last date seen on the form.  Done and initialed by MM/NP.  Alexis to make copy and given to Medical Records.

## 2021-02-12 ENCOUNTER — Other Ambulatory Visit: Payer: Self-pay | Admitting: Adult Health

## 2021-08-10 ENCOUNTER — Other Ambulatory Visit: Payer: Self-pay | Admitting: Adult Health

## 2021-12-24 ENCOUNTER — Telehealth: Payer: Self-pay | Admitting: Adult Health

## 2021-12-24 MED ORDER — ZOLMITRIPTAN 5 MG NA SOLN: 1.0000 | Freq: Two times a day (BID) | NASAL | 0 refills | Status: DC | PRN

## 2021-12-24 NOTE — Telephone Encounter (Signed)
1 refill sent. Pending appt on 01/16/22.

## 2021-12-24 NOTE — Telephone Encounter (Signed)
Pt is requesting a refill for zolmitriptan (ZOMIG) 5 MG nasal solution.  Pharmacy: [facility] [phone]

## 2021-12-24 NOTE — Addendum Note (Signed)
Addended by: Gildardo Griffes on: 12/24/2021 10:38 AM   Modules accepted: Orders

## 2022-01-09 ENCOUNTER — Ambulatory Visit: Payer: BC Managed Care – PPO | Admitting: Adult Health

## 2022-01-09 ENCOUNTER — Encounter: Payer: Self-pay | Admitting: Adult Health

## 2022-01-16 ENCOUNTER — Encounter: Payer: Self-pay | Admitting: Adult Health

## 2022-01-16 ENCOUNTER — Ambulatory Visit: Payer: BC Managed Care – PPO | Admitting: Adult Health

## 2022-01-16 ENCOUNTER — Ambulatory Visit (INDEPENDENT_AMBULATORY_CARE_PROVIDER_SITE_OTHER): Payer: BC Managed Care – PPO | Admitting: Adult Health

## 2022-01-16 VITALS — BP 144/83 | HR 85 | Ht 73.0 in | Wt 280.0 lb

## 2022-01-16 DIAGNOSIS — G43009 Migraine without aura, not intractable, without status migrainosus: Secondary | ICD-10-CM

## 2022-01-16 MED ORDER — NORTRIPTYLINE HCL 10 MG PO CAPS: 10.0000 mg | ORAL_CAPSULE | Freq: Every day | ORAL | 3 refills | Status: DC

## 2022-01-16 NOTE — Progress Notes (Signed)
? ? ?PATIENT: Timothy [name] ?DOB: [birth year] ? ?REASON FOR VISIT: follow up ?HISTORY FROM: patient ? ?HISTORY OF PRESENT ILLNESS: ?Today 01/16/22: ? ?Mr. Luebbers is a 36 year old male with a history of migraine headaches.  He returns today for follow-up.  He states for the last 3 months his headaches have increased.  He reports that he has at least 6 headaches a month.  He states that he is most more working his FMLA is not covering it.  However the patient has not called into our office to report an increase in his headaches.  He also takes Zomig and reports that his headache typically resolves in 20 minutes on occasion depending on the severity of the headache it may not completely resolve it.  He returns today for an evaluation. ? ?01/16/21:Mr. Cavaco is a 36 year old male with a history of migraine headaches.  He returns today for follow-up.  At the last visit we increase Zonegran to 125 mg at bedtime.  He reports that he has approximately 2-3 headaches a month.  He states in February he had for headaches but this was due to stress from work.  He states that typically he can take Zomig and his headache will resolve in "a couple of hours."  He returns today for follow-up ? ?07/12/20: Mr. Sparaco is a 36 year old male with a history of migraine headaches.  He returns today for follow-up.  He reports that he has approximately 2 headaches a week.  He reports that initially he will try to get in a quiet room and use peppermint oil.  He states that this works 50% of the time.  He states if he does not get any benefit then he takes Zomig and his headache will resolve within an hour.  He returns today for an evaluation. ? ?HISTORY 01/10/20: ?  ?Mr. Horwitz is a 36 year old male with a history of migraine headaches.  He returns today for follow-up.  He reports in the last 2 to 3 months his headaches have increased.  Reports that he is having 12-15 headaches a month.  They typically occur in the left frontal region.  He reports  nausea but no vomiting.  On occasion he does have photophobia and phonophobia.  Reports that he uses Zomig with good benefit.  He states that his increase in stress has contributed to his increase in headaches.  He returns today for an evaluation ? ?REVIEW OF SYSTEMS: Out of a complete 14 system review of symptoms, the patient complains only of the following symptoms, and all other reviewed systems are negative. ? ?See HPI ? ?ALLERGIES: ?Allergies  ?Allergen Reactions  ? Topamax [Topiramate] Other (See Comments)  ?  Forgetfulness, losing my train of thought  ? ? ?HOME MEDICATIONS: ?Outpatient Medications Prior to Visit  ?Medication Sig Dispense Refill  ? Multiple Vitamins-Minerals (MULTIVITAMIN ADULT EXTRA C) CHEW Chew by mouth. OTC    ? omeprazole (PRILOSEC) 20 MG capsule Take 20 mg by mouth daily.    ? zolmitriptan (ZOMIG) 5 MG nasal solution Place 1 spray into the nose 2 (two) times daily as needed for migraine. 6 each 0  ? zonisamide (ZONEGRAN) 100 MG capsule TAKE 1 CAPSULE BY MOUTH AT BEDTIME. 90 capsule 3  ? zonisamide (ZONEGRAN) 25 MG capsule TAKE 1 CAPSULE BY MOUTH EVERY DAY 90 capsule 3  ? ?No facility-administered medications prior to visit.  ? ? ?PAST MEDICAL HISTORY: ?Past Medical History:  ?Diagnosis Date  ? Common migraine with intractable migraine 02/12/2017  ? Heart  burn   ? Migraines   ? ? ?PAST SURGICAL HISTORY: ?Past Surgical History:  ?Procedure Laterality Date  ? KNEE ARTHROSCOPY    ? right and left knee x2  ? WISDOM TOOTH EXTRACTION    ? ? ?FAMILY HISTORY: ?Family History  ?Problem Relation Age of Onset  ? Diabetes Mother   ? Prostate cancer Father   ? ? ?SOCIAL HISTORY: ?Social History  ? ?Socioeconomic History  ? Marital status: Single  ?  Spouse name: Not on file  ? Number of children: Not on file  ? Years of education: Not on file  ? Highest education level: Not on file  ?Occupational History  ? Not on file  ?Tobacco Use  ? Smoking status: Former  ?  Types: Cigars  ?  Quit date: 05/10/2017  ?   Years since quitting: 4.6  ? Smokeless tobacco: Never  ?Substance and Sexual Activity  ? Alcohol use: Yes  ?  Comment: Drinks once per week  ? Drug use: Yes  ?  Types: Marijuana  ?  Comment: 3-4 times per month  ? Sexual activity: Not on file  ?Other Topics Concern  ? Not on file  ?Social History Narrative  ? Lives alone  ? Right-handed  ? Caffeine use:  Tea sometimes  ? ?Social Determinants of Health  ? ?Financial Resource Strain: Not on file  ?Food Insecurity: Not on file  ?Transportation Needs: Not on file  ?Physical Activity: Not on file  ?Stress: Not on file  ?Social Connections: Not on file  ?Intimate Partner Violence: Not on file  ? ? ? ? ?PHYSICAL EXAM ? ?Vitals:  ? 01/16/22 1424  ?BP: (!) 144/83  ?Pulse: 85  ?Weight: 280 lb (127 kg)  ?Height: 6\' 1"  (1.854 m)  ? ? ?Body mass index is 36.94 kg/m?. ? ?Generalized: Well developed, in no acute distress  ? ?Neurological examination  ?Mentation: Alert oriented to time, place, history taking. Follows all commands speech and language fluent ?Cranial nerve II-XII: Pupils were equal round reactive to light. Extraocular movements were full, visual field were full on confrontational test.  Head turning and shoulder shrug  were normal and symmetric. ?Motor: The motor testing reveals 5 over 5 strength of all 4 extremities. Good symmetric motor tone is noted throughout.  ?Sensory: Sensory testing is intact to soft touch on all 4 extremities. No evidence of extinction is noted.  ?Coordination: Cerebellar testing reveals good finger-nose-finger and heel-to-shin bilaterally.  ?Gait and station: Gait is normal.  ?Reflexes: Deep tendon reflexes are symmetric and normal bilaterally.  ? ?DIAGNOSTIC DATA (LABS, IMAGING, TESTING) ?- I reviewed patient records, labs, notes, testing and imaging myself where available. ? ? ? ? ?ASSESSMENT AND PLAN ?36 y.o. year old male  has a past medical history of Common migraine with intractable migraine (02/12/2017), Heart burn, and Migraines. here  with: ? ?Migraine headaches ? ?Decrease Zonegran to 100 mg at bedtime ?Start nortriptyline 10 mg at bedtime reviewed this medication with the patient and attached information to his after visit summary ?Continue Zomig as an abortive therapy ?If headaches do not improve he should let us know ?Advised patient that anytime his headaches are increasing and he is missing work he should let us know. Up until now he is only tried Zonegran to treat his headaches. ?Follow-up in 6 months or sooner if needed ? ? ? ? ?Ward Givens, MSN, NP-C 01/16/2022, 2:24 PM ?[facility] ?[address], [address] ?[place], [place] [address] ?[phone] ? ?

## 2022-01-16 NOTE — Patient Instructions (Addendum)
Your Plan: ? ?Decrease Zonegran to 100 mg daily ?Add Nortriptyline 10 mg at bedtime ?Continue Zomig for abortive therapy ? ? ?Thank you for coming to see [place] at [facility]. I hope we have been able to provide you high quality care today. ? ?You may receive a patient satisfaction survey over the next few weeks. We would appreciate your feedback and comments so that we may continue to improve ourselves and the health of our patients. ? ?

## 2022-01-30 ENCOUNTER — Encounter: Payer: Self-pay | Admitting: Adult Health

## 2022-01-30 NOTE — Telephone Encounter (Signed)
FMLA from last year signed 01-30-2021 showed 2 episodes / month.  (Form in media). ?

## 2022-02-04 DIAGNOSIS — Z0289 Encounter for other administrative examinations: Secondary | ICD-10-CM

## 2022-02-04 NOTE — Telephone Encounter (Signed)
This is fine 

## 2022-02-06 NOTE — Telephone Encounter (Signed)
FMLA form completed to MM/NP for review and signature.  (On letter states previous FMLA expired 01-16-2022.  Pt was out 01-24-2022 do you want to add to additional note?pg 5 of new FMLA form?  ?

## 2022-02-07 ENCOUNTER — Other Ambulatory Visit: Payer: Self-pay | Admitting: Adult Health

## 2022-02-08 ENCOUNTER — Other Ambulatory Visit: Payer: Self-pay | Admitting: Adult Health

## 2022-02-10 NOTE — Telephone Encounter (Signed)
Will keep form as is.  Signed and to medical records.  ?

## 2022-02-18 ENCOUNTER — Encounter: Payer: Self-pay | Admitting: Adult Health

## 2022-04-02 DIAGNOSIS — J029 Acute pharyngitis, unspecified: Secondary | ICD-10-CM | POA: Diagnosis not present

## 2022-07-23 ENCOUNTER — Ambulatory Visit (INDEPENDENT_AMBULATORY_CARE_PROVIDER_SITE_OTHER): Payer: BC Managed Care – PPO | Admitting: Adult Health

## 2022-07-23 ENCOUNTER — Encounter: Payer: Self-pay | Admitting: Adult Health

## 2022-07-23 VITALS — BP 130/76 | HR 63 | Ht 73.0 in | Wt 282.2 lb

## 2022-07-23 DIAGNOSIS — G43009 Migraine without aura, not intractable, without status migrainosus: Secondary | ICD-10-CM

## 2022-07-23 NOTE — Progress Notes (Signed)
PATIENT: Timothy [name] DOB: [birth year]  REASON FOR VISIT: follow up HISTORY FROM: patient  Chief Complaint  Patient presents with   Follow-up    Pt in 20   Pt here for migraines f/u Pt states 3 migraines in last month      HISTORY OF PRESENT ILLNESS: Today 07/23/22:  Timothy [name] is a 36 year old male with a history of migraine headaches.  He returns today for follow-up. Reports that last month he only had 3 headache last month which was better. Right now he wants to keep the medication the same.  His headaches have improved since he added on nortriptyline.  Currently taking nortriptyline 10 mg as well as Zonegran 100 mg.  01/16/22: Timothy [name] is a 36 year old male with a history of migraine headaches.  He returns today for follow-up.  He states for the last 3 months his headaches have increased.  He reports that he has at least 6 headaches a month.  He states that he is most more working his FMLA is not covering it.  However the patient has not called into our office to report an increase in his headaches.  He also takes Zomig and reports that his headache typically resolves in 20 minutes on occasion depending on the severity of the headache it may not completely resolve it.  He returns today for an evaluation.  01/16/21:Timothy [name] is a 36 year old male with a history of migraine headaches.  He returns today for follow-up.  At the last visit we increase Zonegran to 125 mg at bedtime.  He reports that he has approximately 2-3 headaches a month.  He states in February he had for headaches but this was due to stress from work.  He states that typically he can take Zomig and his headache will resolve in "a couple of hours."  He returns today for follow-up  07/12/20: Timothy [name] is a 36 year old male with a history of migraine headaches.  He returns today for follow-up.  He reports that he has approximately 2 headaches a week.  He reports that initially he will try to get in a quiet room and use  peppermint oil.  He states that this works 50% of the time.  He states if he does not get any benefit then he takes Zomig and his headache will resolve within an hour.  He returns today for an evaluation.  HISTORY 01/10/20:   Timothy [name] is a 36 year old male with a history of migraine headaches.  He returns today for follow-up.  He reports in the last 2 to 3 months his headaches have increased.  Reports that he is having 12-15 headaches a month.  They typically occur in the left frontal region.  He reports nausea but no vomiting.  On occasion he does have photophobia and phonophobia.  Reports that he uses Zomig with good benefit.  He states that his increase in stress has contributed to his increase in headaches.  He returns today for an evaluation  REVIEW OF SYSTEMS: Out of a complete 14 system review of symptoms, the patient complains only of the following symptoms, and all other reviewed systems are negative.  See HPI  ALLERGIES: Allergies  Allergen Reactions   Topamax [Topiramate] Other (See Comments)    Forgetfulness, losing my train of thought    HOME MEDICATIONS: Outpatient Medications Prior to Visit  Medication Sig Dispense Refill   Multiple Vitamins-Minerals (MULTIVITAMIN ADULT EXTRA C) CHEW Chew by mouth. OTC     nortriptyline (PAMELOR) 10  MG capsule TAKE 1 CAPSULE BY MOUTH AT BEDTIME. 90 capsule 1   omeprazole (PRILOSEC) 20 MG capsule Take 20 mg by mouth daily.     zolmitriptan (ZOMIG) 5 MG nasal solution Place 1 spray into the nose 2 (two) times daily as needed for migraine. 6 each 0   zonisamide (ZONEGRAN) 100 MG capsule TAKE 1 CAPSULE BY MOUTH EVERYDAY AT BEDTIME 90 capsule 1   zonisamide (ZONEGRAN) 25 MG capsule TAKE 1 CAPSULE BY MOUTH EVERY DAY (Patient not taking: Reported on 07/23/2022) 90 capsule 3   No facility-administered medications prior to visit.    PAST MEDICAL HISTORY: Past Medical History:  Diagnosis Date   Common migraine with intractable migraine 02/12/2017    Heart burn    Migraines     PAST SURGICAL HISTORY: Past Surgical History:  Procedure Laterality Date   KNEE ARTHROSCOPY     right and left knee x2   WISDOM TOOTH EXTRACTION      FAMILY HISTORY: Family History  Problem Relation Age of Onset   Diabetes Mother    Prostate cancer Father    Migraines Neg Hx     SOCIAL HISTORY: Social History   Socioeconomic History   Marital status: Single    Spouse name: Not on file   Number of children: Not on file   Years of education: Not on file   Highest education level: Not on file  Occupational History   Not on file  Tobacco Use   Smoking status: Former    Types: Cigars    Quit date: 05/10/2017    Years since quitting: 5.2   Smokeless tobacco: Never  Substance and Sexual Activity   Alcohol use: Yes    Alcohol/week: 14.0 standard drinks of alcohol    Types: 6 Cans of beer, 8 Shots of liquor per week    Comment: Drinks once per week   Drug use: Yes    Types: Marijuana    Comment: 3-4 times per month   Sexual activity: Not on file  Other Topics Concern   Not on file  Social History Narrative   Lives alone   Right-handed   Caffeine use:  Tea sometimes   Social Determinants of Corporate investment banker Strain: Not on file  Food Insecurity: Not on file  Transportation Needs: Not on file  Physical Activity: Not on file  Stress: Not on file  Social Connections: Not on file  Intimate Partner Violence: Not on file      PHYSICAL EXAM  Vitals:   07/23/22 1350  BP: 130/76  Pulse: 63  Weight: 282 lb 3.2 oz (128 kg)  Height: 6\' 1"  (1.854 m)    Body mass index is 37.23 kg/m.  Generalized: Well developed, in no acute distress   Neurological examination  Mentation: Alert oriented to time, place, history taking. Follows all commands speech and language fluent Cranial nerve II-XII: Pupils were equal round reactive to light. Extraocular movements were full, visual field were full on confrontational test.  Head  turning and shoulder shrug  were normal and symmetric. Motor: The motor testing reveals 5 over 5 strength of all 4 extremities. Good symmetric motor tone is noted throughout.  Sensory: Sensory testing is intact to soft touch on all 4 extremities. No evidence of extinction is noted.  Coordination: Cerebellar testing reveals good finger-nose-finger and heel-to-shin bilaterally.  Gait and station: Gait is normal.  Reflexes: Deep tendon reflexes are symmetric and normal bilaterally.   DIAGNOSTIC DATA (LABS, IMAGING, TESTING) -  I reviewed patient records, labs, notes, testing and imaging myself where available.     ASSESSMENT AND PLAN 36 y.o. year old male  has a past medical history of Common migraine with intractable migraine (02/12/2017), Heart burn, and Migraines. here with:  Migraine headaches  Continue Zonegran to 100 mg at bedtime Continue nortriptyline 10 mg at bedtime Continue Zomig as an abortive therapy If headaches do not improve he should let us know  Follow-up in 6 months or sooner if needed     Butch Penny, MSN, NP-C 07/23/2022, 2:09 PM [facility] [address], [address] [place], [place] [address] [phone]

## 2022-08-28 ENCOUNTER — Other Ambulatory Visit: Payer: Self-pay | Admitting: Adult Health

## 2022-09-08 DIAGNOSIS — Z0000 Encounter for general adult medical examination without abnormal findings: Secondary | ICD-10-CM | POA: Diagnosis not present

## 2022-09-08 DIAGNOSIS — Z13220 Encounter for screening for lipoid disorders: Secondary | ICD-10-CM | POA: Diagnosis not present

## 2022-09-08 DIAGNOSIS — Z23 Encounter for immunization: Secondary | ICD-10-CM | POA: Diagnosis not present

## 2022-09-08 DIAGNOSIS — Z131 Encounter for screening for diabetes mellitus: Secondary | ICD-10-CM | POA: Diagnosis not present

## 2022-09-30 DIAGNOSIS — X509XXA Other and unspecified overexertion or strenuous movements or postures, initial encounter: Secondary | ICD-10-CM | POA: Diagnosis not present

## 2022-09-30 DIAGNOSIS — M25531 Pain in right wrist: Secondary | ICD-10-CM | POA: Diagnosis not present

## 2022-11-27 DIAGNOSIS — F4322 Adjustment disorder with anxiety: Secondary | ICD-10-CM | POA: Diagnosis not present

## 2022-12-04 DIAGNOSIS — R109 Unspecified abdominal pain: Secondary | ICD-10-CM | POA: Diagnosis not present

## 2023-01-19 DIAGNOSIS — F5101 Primary insomnia: Secondary | ICD-10-CM | POA: Diagnosis not present

## 2023-01-19 DIAGNOSIS — F332 Major depressive disorder, recurrent severe without psychotic features: Secondary | ICD-10-CM | POA: Diagnosis not present

## 2023-01-21 DIAGNOSIS — Z03818 Encounter for observation for suspected exposure to other biological agents ruled out: Secondary | ICD-10-CM | POA: Diagnosis not present

## 2023-01-21 DIAGNOSIS — R509 Fever, unspecified: Secondary | ICD-10-CM | POA: Diagnosis not present

## 2023-01-21 DIAGNOSIS — J029 Acute pharyngitis, unspecified: Secondary | ICD-10-CM | POA: Diagnosis not present

## 2023-01-28 ENCOUNTER — Ambulatory Visit: Payer: BC Managed Care – PPO | Admitting: Adult Health

## 2023-02-05 ENCOUNTER — Encounter: Payer: Self-pay | Admitting: Adult Health

## 2023-02-05 ENCOUNTER — Ambulatory Visit (INDEPENDENT_AMBULATORY_CARE_PROVIDER_SITE_OTHER): Payer: BC Managed Care – PPO | Admitting: Adult Health

## 2023-02-05 VITALS — BP 120/76 | HR 82 | Ht 73.0 in | Wt 282.4 lb

## 2023-02-05 DIAGNOSIS — G43009 Migraine without aura, not intractable, without status migrainosus: Secondary | ICD-10-CM | POA: Diagnosis not present

## 2023-02-05 DIAGNOSIS — Z0289 Encounter for other administrative examinations: Secondary | ICD-10-CM

## 2023-02-05 MED ORDER — NORTRIPTYLINE HCL 10 MG PO CAPS: 20.0000 mg | ORAL_CAPSULE | Freq: Every day | ORAL | 3 refills | Status: DC

## 2023-02-05 MED ORDER — ZONISAMIDE 100 MG PO CAPS: ORAL_CAPSULE | ORAL | 3 refills | Status: AC

## 2023-02-05 MED ORDER — ZOLMITRIPTAN 5 MG NA SOLN: 1.0000 | Freq: Two times a day (BID) | NASAL | 11 refills | Status: AC | PRN

## 2023-02-05 NOTE — Patient Instructions (Addendum)
Your Plan:   Continue Zonegran to 100 mg at bedtime Increase nortriptyline 20 mg at bedtime Continue Zomig as an abortive therapy   Thank you for coming to see [place] at [facility]. I hope we have been able to provide you high quality care today.  You may receive a patient satisfaction survey over the next few weeks. We would appreciate your feedback and comments so that we may continue to improve ourselves and the health of our patients.

## 2023-02-05 NOTE — Progress Notes (Signed)
PATIENT: Timothy [name] DOB: August 14, 1986  REASON FOR VISIT: follow up HISTORY FROM: patient  Chief Complaint  Patient presents with   Follow-up    Pt in 7 pt here for Migraine f/u Pt states migraines are a little better      HISTORY OF PRESENT ILLNESS: Today 02/05/23:  Timothy [name] is a 37 y.o. male with a history of Migraine headaches. Returns today for follow-up. Headaches were worse in December and January- feels that it due to stress. Reports that now he is having about 1 headache a week. Continues on Zonegran and nortriptyline. Uses zomig for abortive therapy.   07/23/22: Timothy [name] is a 37 year old male with a history of migraine headaches.  He returns today for follow-up. Reports that last month he only had 3 headache last month which was better. Right now he wants to keep the medication the same.  His headaches have improved since he added on nortriptyline.  Currently taking nortriptyline 10 mg as well as Zonegran 100 mg.  01/16/22: Timothy [name] is a 37 year old male with a history of migraine headaches.  He returns today for follow-up.  He states for the last 3 months his headaches have increased.  He reports that he has at least 6 headaches a month.  He states that he is most more working his FMLA is not covering it.  However the patient has not called into our office to report an increase in his headaches.  He also takes Zomig and reports that his headache typically resolves in 20 minutes on occasion depending on the severity of the headache it may not completely resolve it.  He returns today for an evaluation.  01/16/21:Timothy [name] is a 37 year old male with a history of migraine headaches.  He returns today for follow-up.  At the last visit we increase Zonegran to 125 mg at bedtime.  He reports that he has approximately 2-3 headaches a month.  He states in February he had for headaches but this was due to stress from work.  He states that typically he can take Zomig and his headache  will resolve in "a couple of hours."  He returns today for follow-up  07/12/20: Timothy [name] is a 37 year old male with a history of migraine headaches.  He returns today for follow-up.  He reports that he has approximately 2 headaches a week.  He reports that initially he will try to get in a quiet room and use peppermint oil.  He states that this works 50% of the time.  He states if he does not get any benefit then he takes Zomig and his headache will resolve within an hour.  He returns today for an evaluation.  HISTORY 01/10/20:   Timothy [name] is a 37 year old male with a history of migraine headaches.  He returns today for follow-up.  He reports in the last 2 to 3 months his headaches have increased.  Reports that he is having 12-15 headaches a month.  They typically occur in the left frontal region.  He reports nausea but no vomiting.  On occasion he does have photophobia and phonophobia.  Reports that he uses Zomig with good benefit.  He states that his increase in stress has contributed to his increase in headaches.  He returns today for an evaluation  REVIEW OF SYSTEMS: Out of a complete 14 system review of symptoms, the patient complains only of the following symptoms, and all other reviewed systems are negative.  See HPI  ALLERGIES: Allergies  Allergen  Reactions   Topamax [Topiramate] Other (See Comments)    Forgetfulness, losing my train of thought    HOME MEDICATIONS: Outpatient Medications Prior to Visit  Medication Sig Dispense Refill   Multiple Vitamins-Minerals (MULTIVITAMIN ADULT EXTRA C) CHEW Chew by mouth. OTC     nortriptyline (PAMELOR) 10 MG capsule TAKE 1 CAPSULE BY MOUTH EVERYDAY AT BEDTIME 90 capsule 1   omeprazole (PRILOSEC) 20 MG capsule Take 20 mg by mouth daily.     zolmitriptan (ZOMIG) 5 MG nasal solution Place 1 spray into the nose 2 (two) times daily as needed for migraine. 6 each 0   zonisamide (ZONEGRAN) 100 MG capsule TAKE 1 CAPSULE BY MOUTH EVERYDAY AT BEDTIME  90 capsule 1   No facility-administered medications prior to visit.    PAST MEDICAL HISTORY: Past Medical History:  Diagnosis Date   Common migraine with intractable migraine 02/12/2017   Heart burn    Migraines     PAST SURGICAL HISTORY: Past Surgical History:  Procedure Laterality Date   KNEE ARTHROSCOPY     right and left knee x2   WISDOM TOOTH EXTRACTION      FAMILY HISTORY: Family History  Problem Relation Age of Onset   Diabetes Mother    Prostate cancer Father    Migraines Neg Hx     SOCIAL HISTORY: Social History   Socioeconomic History   Marital status: Single    Spouse name: Not on file   Number of children: Not on file   Years of education: Not on file   Highest education level: Not on file  Occupational History   Not on file  Tobacco Use   Smoking status: Former    Types: Cigars    Quit date: 05/10/2017    Years since quitting: 5.7   Smokeless tobacco: Never  Substance and Sexual Activity   Alcohol use: Yes    Alcohol/week: 8.0 standard drinks of alcohol    Types: 5 Cans of beer, 3 Shots of liquor per week    Comment: Drinks once per week   Drug use: Yes    Types: Marijuana    Comment: 3-4 times per month   Sexual activity: Not on file  Other Topics Concern   Not on file  Social History Narrative   Lives alone   Right-handed   Caffeine use:  Tea sometimes   Social Determinants of Radio broadcast assistant Strain: Not on file  Food Insecurity: Not on file  Transportation Needs: Not on file  Physical Activity: Not on file  Stress: Not on file  Social Connections: Not on file  Intimate Partner Violence: Not on file      PHYSICAL EXAM  Vitals:   02/05/23 0819  BP: 120/76  Pulse: 82  Weight: 282 lb 6.4 oz (128.1 kg)  Height: 6\' 1"  (1.854 m)    Body mass index is 37.26 kg/m.  Generalized: Well developed, in no acute distress   Neurological examination  Mentation: Alert oriented to time, place, history taking. Follows all  commands speech and language fluent Cranial nerve II-XII: Pupils were equal round reactive to light. Extraocular movements were full, visual field were full on confrontational test.  Head turning and shoulder shrug  were normal and symmetric. Motor: The motor testing reveals 5 over 5 strength of all 4 extremities. Good symmetric motor tone is noted throughout.  Sensory: Sensory testing is intact to soft touch on all 4 extremities. No evidence of extinction is noted.  Coordination: Cerebellar testing  reveals good finger-nose-finger and heel-to-shin bilaterally.  Gait and station: Gait is normal.  Reflexes: Deep tendon reflexes are symmetric and normal bilaterally.   DIAGNOSTIC DATA (LABS, IMAGING, TESTING) - I reviewed patient records, labs, notes, testing and imaging myself where available.     ASSESSMENT AND PLAN 37 y.o. year old male  has a past medical history of Common migraine with intractable migraine (02/12/2017), Heart burn, and Migraines. here with:  Migraine headaches  Continue Zonegran 100 mg at bedtime Increase nortriptyline 20 mg at bedtime Continue Zomig as an abortive therapy If headaches do not improve he should let us know  Follow-up in 6 months or sooner if needed     Ward Givens, MSN, NP-C 02/05/2023, 8:47 AM [facility] [address], [place], [place] [address] [phone]

## 2023-02-09 ENCOUNTER — Telehealth: Payer: Self-pay | Admitting: *Deleted

## 2023-02-09 DIAGNOSIS — Z0289 Encounter for other administrative examinations: Secondary | ICD-10-CM

## 2023-02-09 NOTE — Telephone Encounter (Signed)
FMLA form completed, signed (by Dr Leonie Man on behalf of MM NP) and sent to medical records for processing.

## 2023-02-18 DIAGNOSIS — F4310 Post-traumatic stress disorder, unspecified: Secondary | ICD-10-CM | POA: Diagnosis not present

## 2023-02-25 DIAGNOSIS — F4310 Post-traumatic stress disorder, unspecified: Secondary | ICD-10-CM | POA: Diagnosis not present

## 2023-03-02 DIAGNOSIS — F331 Major depressive disorder, recurrent, moderate: Secondary | ICD-10-CM | POA: Diagnosis not present

## 2023-03-02 DIAGNOSIS — F5101 Primary insomnia: Secondary | ICD-10-CM | POA: Diagnosis not present

## 2023-03-12 ENCOUNTER — Ambulatory Visit: Payer: BC Managed Care – PPO | Admitting: Adult Health

## 2023-03-18 DIAGNOSIS — F4310 Post-traumatic stress disorder, unspecified: Secondary | ICD-10-CM | POA: Diagnosis not present

## 2023-03-30 DIAGNOSIS — F5101 Primary insomnia: Secondary | ICD-10-CM | POA: Diagnosis not present

## 2023-03-30 DIAGNOSIS — F331 Major depressive disorder, recurrent, moderate: Secondary | ICD-10-CM | POA: Diagnosis not present

## 2023-04-01 DIAGNOSIS — F4310 Post-traumatic stress disorder, unspecified: Secondary | ICD-10-CM | POA: Diagnosis not present

## 2023-04-22 DIAGNOSIS — F4310 Post-traumatic stress disorder, unspecified: Secondary | ICD-10-CM | POA: Diagnosis not present

## 2023-04-29 DIAGNOSIS — F5101 Primary insomnia: Secondary | ICD-10-CM | POA: Diagnosis not present

## 2023-04-29 DIAGNOSIS — F3342 Major depressive disorder, recurrent, in full remission: Secondary | ICD-10-CM | POA: Diagnosis not present

## 2023-05-06 DIAGNOSIS — F4310 Post-traumatic stress disorder, unspecified: Secondary | ICD-10-CM | POA: Diagnosis not present

## 2023-06-03 DIAGNOSIS — F4310 Post-traumatic stress disorder, unspecified: Secondary | ICD-10-CM | POA: Diagnosis not present

## 2023-06-22 DIAGNOSIS — F4310 Post-traumatic stress disorder, unspecified: Secondary | ICD-10-CM | POA: Diagnosis not present

## 2023-06-24 DIAGNOSIS — F3342 Major depressive disorder, recurrent, in full remission: Secondary | ICD-10-CM | POA: Diagnosis not present

## 2023-06-24 DIAGNOSIS — F5101 Primary insomnia: Secondary | ICD-10-CM | POA: Diagnosis not present

## 2023-07-02 ENCOUNTER — Telehealth: Payer: Self-pay | Admitting: Adult Health

## 2023-07-02 NOTE — Telephone Encounter (Signed)
Did he drop off FMLA form and pay fee? We did one for him in April 2024.

## 2023-07-02 NOTE — Telephone Encounter (Signed)
Pt came into this office to drop off paper work. FMLA paper work is now expired and now needs job Doctor, general practice paper work filled out. Would like a call if there are an issues or questions

## 2023-07-06 DIAGNOSIS — F4310 Post-traumatic stress disorder, unspecified: Secondary | ICD-10-CM | POA: Diagnosis not present

## 2023-07-07 DIAGNOSIS — Z0289 Encounter for other administrative examinations: Secondary | ICD-10-CM

## 2023-07-09 NOTE — Telephone Encounter (Signed)
This time patient sent in a job accommodation form.  I called him.  He states he no longer qualifies for FMLA so the job accommodation form is what is now due.  He states we can fill it out the same way.  He understands we only allow 4 episodes per month each lasting 1 day and he is not asking for any additional days at this time.  I did advise that in the future if he does have an extended migraine he is welcome to give our office a call to see if there is anything we can do.  He verbalized understanding and appreciation for the call.

## 2023-07-15 NOTE — Telephone Encounter (Signed)
Accomodation form completed, signed, and sent to medical records.

## 2023-07-23 ENCOUNTER — Telehealth (INDEPENDENT_AMBULATORY_CARE_PROVIDER_SITE_OTHER): Payer: BC Managed Care – PPO | Admitting: Adult Health

## 2023-07-23 DIAGNOSIS — G43009 Migraine without aura, not intractable, without status migrainosus: Secondary | ICD-10-CM

## 2023-07-23 NOTE — Telephone Encounter (Signed)
Per MM NP placed specific July & August dates on accomodation form as requested by pt. Form placed in medical records office for processing/patient pickup.

## 2023-07-23 NOTE — Progress Notes (Addendum)
PATIENT: Timothy [name] DOB: 07-Aug-1986  REASON FOR VISIT: follow up HISTORY FROM: patient  Virtual Visit via Video Note  I connected with Timothy [name] on 07/23/23 at  8:30 AM EDT by a video enabled telemedicine application located remotely at [facility] and verified that I am speaking with the correct person using two identifiers who was located at their own home.   I discussed the limitations of evaluation and management by telemedicine and the availability of in person appointments. The patient expressed understanding and agreed to proceed.   PATIENT: Timothy [name] DOB: 07/01/1986  REASON FOR VISIT: follow up HISTORY FROM: patient  HISTORY OF PRESENT ILLNESS: Today 07/23/23:  Timothy [name] is a 37 y.o. male with a history of migraine headaches. Returns today for follow-up.  He reports that July and August he did not take his medication consistently.  Therefore his migraines increased.  He states that he started taking his medication consistently at the end of August and is only had 1 migraine since then.  Remains on nortriptyline 20 mg at bedtime and Zonegran 100 mg at bedtime.  Uses Zomig for abortive therapy   REVIEW OF SYSTEMS: Out of a complete 14 system review of symptoms, the patient complains only of the following symptoms, and all other reviewed systems are negative.  ALLERGIES: Allergies  Allergen Reactions   Topamax [Topiramate] Other (See Comments)    Forgetfulness, losing my train of thought    HOME MEDICATIONS: Outpatient Medications Prior to Visit  Medication Sig Dispense Refill   Multiple Vitamins-Minerals (MULTIVITAMIN ADULT EXTRA C) CHEW Chew by mouth. OTC     nortriptyline (PAMELOR) 10 MG capsule Take 2 capsules (20 mg total) by mouth at bedtime. 180 capsule 3   omeprazole (PRILOSEC) 20 MG capsule Take 20 mg by mouth daily.     zolmitriptan (ZOMIG) 5 MG nasal solution Place 1 spray into the nose 2 (two) times daily as needed  for migraine. 6 each 11   zonisamide (ZONEGRAN) 100 MG capsule TAKE 1 CAPSULE BY MOUTH EVERYDAY AT BEDTIME 90 capsule 3   No facility-administered medications prior to visit.    PAST MEDICAL HISTORY: Past Medical History:  Diagnosis Date   Common migraine with intractable migraine 02/12/2017   Heart burn    Migraines     PAST SURGICAL HISTORY: Past Surgical History:  Procedure Laterality Date   KNEE ARTHROSCOPY     right and left knee x2   WISDOM TOOTH EXTRACTION      FAMILY HISTORY: Family History  Problem Relation Age of Onset   Diabetes Mother    Prostate cancer Father    Migraines Neg Hx     SOCIAL HISTORY: Social History   Socioeconomic History   Marital status: Single    Spouse name: Not on file   Number of children: Not on file   Years of education: Not on file   Highest education level: Not on file  Occupational History   Not on file  Tobacco Use   Smoking status: Former    Types: Cigars    Quit date: 05/10/2017    Years since quitting: 6.2   Smokeless tobacco: Never  Substance and Sexual Activity   Alcohol use: Yes    Alcohol/week: 8.0 standard drinks of alcohol    Types: 5 Cans of beer, 3 Shots of liquor per week    Comment: Drinks once per week   Drug use: Yes    Types: Marijuana    Comment:  3-4 times per month   Sexual activity: Not on file  Other Topics Concern   Not on file  Social History Narrative   Lives alone   Right-handed   Caffeine use:  Tea sometimes   Social Determinants of Health   Financial Resource Strain: Not on file  Food Insecurity: Not on file  Transportation Needs: Not on file  Physical Activity: Not on file  Stress: Not on file  Social Connections: Not on file  Intimate Partner Violence: Not on file      PHYSICAL EXAM Generalized: Well developed, in no acute distress   Neurological examination  Mentation: Alert oriented to time, place, history taking. Follows all commands speech and language fluent Cranial  nerve II-XII:Facial symmetry noted.   DIAGNOSTIC DATA (LABS, IMAGING, TESTING) - I reviewed patient records, labs, notes, testing and imaging myself where available.     ASSESSMENT AND PLAN 37 y.o. year old male  has a past medical history of Common migraine with intractable migraine (02/12/2017), Heart burn, and Migraines. here with:  Migraine   -Continue nortriptyline 20 mg at bedtime -Continue Zonegran 100 mg at bedtime -Continue Zomig for abortive therapy -Follow-up in 6 months or sooner if needed    Butch Penny, MSN, NP-C 07/23/2023, 8:45 AM [facility] [address] [place], [place] [address] [phone]

## 2023-07-27 DIAGNOSIS — F4310 Post-traumatic stress disorder, unspecified: Secondary | ICD-10-CM | POA: Diagnosis not present

## 2023-07-28 ENCOUNTER — Encounter: Payer: Self-pay | Admitting: Adult Health

## 2023-08-21 DIAGNOSIS — F3342 Major depressive disorder, recurrent, in full remission: Secondary | ICD-10-CM | POA: Diagnosis not present

## 2023-08-21 DIAGNOSIS — F5101 Primary insomnia: Secondary | ICD-10-CM | POA: Diagnosis not present

## 2023-08-24 DIAGNOSIS — F4310 Post-traumatic stress disorder, unspecified: Secondary | ICD-10-CM | POA: Diagnosis not present

## 2023-09-09 DIAGNOSIS — F4310 Post-traumatic stress disorder, unspecified: Secondary | ICD-10-CM | POA: Diagnosis not present

## 2023-09-10 DIAGNOSIS — Z23 Encounter for immunization: Secondary | ICD-10-CM | POA: Diagnosis not present

## 2023-09-10 DIAGNOSIS — Z131 Encounter for screening for diabetes mellitus: Secondary | ICD-10-CM | POA: Diagnosis not present

## 2023-09-10 DIAGNOSIS — Z13228 Encounter for screening for other metabolic disorders: Secondary | ICD-10-CM | POA: Diagnosis not present

## 2023-09-10 DIAGNOSIS — Z0000 Encounter for general adult medical examination without abnormal findings: Secondary | ICD-10-CM | POA: Diagnosis not present

## 2023-09-10 DIAGNOSIS — Z125 Encounter for screening for malignant neoplasm of prostate: Secondary | ICD-10-CM | POA: Diagnosis not present

## 2023-09-10 DIAGNOSIS — Z13220 Encounter for screening for lipoid disorders: Secondary | ICD-10-CM | POA: Diagnosis not present

## 2023-09-21 ENCOUNTER — Ambulatory Visit: Payer: BC Managed Care – PPO | Admitting: Adult Health

## 2023-10-02 DIAGNOSIS — K219 Gastro-esophageal reflux disease without esophagitis: Secondary | ICD-10-CM | POA: Diagnosis not present

## 2023-10-02 DIAGNOSIS — K449 Diaphragmatic hernia without obstruction or gangrene: Secondary | ICD-10-CM | POA: Diagnosis not present

## 2024-02-19 ENCOUNTER — Telehealth (INDEPENDENT_AMBULATORY_CARE_PROVIDER_SITE_OTHER): Payer: BC Managed Care – PPO | Admitting: Adult Health

## 2024-02-19 DIAGNOSIS — G43009 Migraine without aura, not intractable, without status migrainosus: Secondary | ICD-10-CM | POA: Diagnosis not present

## 2024-02-19 NOTE — Patient Instructions (Addendum)
-  Decrease nortriptyline 1 capsule nightly for 4 days then stop medication  -Continue Zonegran 100 mg at bedtime -Continue Zomig for abortive therapy If your symptoms worsen or you develop new symptoms please let us know.

## 2024-02-19 NOTE — Progress Notes (Signed)
 PATIENT: Timothy [name] DOB: 11/25/1985  REASON FOR VISIT: follow up HISTORY FROM: patient  Virtual Visit via Video Note  I connected with Timothy [name] on 02/19/24 at  9:30 AM EDT by a video enabled telemedicine application located remotely at [facility] and verified that I am speaking with the correct person using two identifiers who was located at their own home.   I discussed the limitations of evaluation and management by telemedicine and the availability of in person appointments. The patient expressed understanding and agreed to proceed.   PATIENT: Timothy [name] DOB: 09-26-1986  REASON FOR VISIT: follow up HISTORY FROM: patient  HISTORY OF PRESENT ILLNESS: Today 02/19/24:  Timothy [name] is a 38 y.o. male with a history of Migraines. Returns today for follow-up. Reports that migraines have been great. Only 1 headache a month.  Reports that he does not take his medication consistently.  He states that he often skips nights.  He would like to wean off the nortriptyline if possible.  Reports that the last time he took nortriptyline was Wednesday night.  Forgot to take it last night.  Reports that he seems to be more consistent with Zonegran.  Continues to use Zomig for abortive therapy     07/23/23: Timothy [name] is a 38 y.o. male with a history of migraine headaches. Returns today for follow-up.  He reports that July and August he did not take his medication consistently.  Therefore his migraines increased.  He states that he started taking his medication consistently at the end of August and is only had 1 migraine since then.  Remains on nortriptyline 20 mg at bedtime and Zonegran 100 mg at bedtime.  Uses Zomig for abortive therapy   REVIEW OF SYSTEMS: Out of a complete 14 system review of symptoms, the patient complains only of the following symptoms, and all other reviewed systems are negative.  ALLERGIES: Allergies  Allergen Reactions   Topamax  [Topiramate] Other (See Comments)    Forgetfulness, losing my train of thought    HOME MEDICATIONS: Outpatient Medications Prior to Visit  Medication Sig Dispense Refill   Multiple Vitamins-Minerals (MULTIVITAMIN ADULT EXTRA C) CHEW Chew by mouth. OTC     nortriptyline (PAMELOR) 10 MG capsule Take 2 capsules (20 mg total) by mouth at bedtime. 180 capsule 3   omeprazole (PRILOSEC) 20 MG capsule Take 20 mg by mouth daily.     zolmitriptan (ZOMIG) 5 MG nasal solution Place 1 spray into the nose 2 (two) times daily as needed for migraine. 6 each 11   zonisamide (ZONEGRAN) 100 MG capsule TAKE 1 CAPSULE BY MOUTH EVERYDAY AT BEDTIME 90 capsule 3   No facility-administered medications prior to visit.    PAST MEDICAL HISTORY: Past Medical History:  Diagnosis Date   Common migraine with intractable migraine 02/12/2017   Heart burn    Migraines     PAST SURGICAL HISTORY: Past Surgical History:  Procedure Laterality Date   KNEE ARTHROSCOPY     right and left knee x2   WISDOM TOOTH EXTRACTION      FAMILY HISTORY: Family History  Problem Relation Age of Onset   Diabetes Mother    Prostate cancer Father    Migraines Neg Hx     SOCIAL HISTORY: Social History   Socioeconomic History   Marital status: Single    Spouse name: Not on file   Number of children: Not on file   Years of education: Not on file   Highest  education level: Not on file  Occupational History   Not on file  Tobacco Use   Smoking status: Former    Types: Cigars    Quit date: 05/10/2017    Years since quitting: 6.7   Smokeless tobacco: Never  Substance and Sexual Activity   Alcohol use: Yes    Alcohol/week: 8.0 standard drinks of alcohol    Types: 5 Cans of beer, 3 Shots of liquor per week    Comment: Drinks once per week   Drug use: Yes    Types: Marijuana    Comment: 3-4 times per month   Sexual activity: Not on file  Other Topics Concern   Not on file  Social History Narrative   Lives alone    Right-handed   Caffeine use:  Tea sometimes   Social Drivers of Corporate investment banker Strain: Not on file  Food Insecurity: Not on file  Transportation Needs: Not on file  Physical Activity: Not on file  Stress: Not on file  Social Connections: Not on file  Intimate Partner Violence: Not on file      PHYSICAL EXAM Generalized: Well developed, in no acute distress   Neurological examination  Mentation: Alert oriented to time, place, history taking. Follows all commands speech and language fluent Cranial nerve II-XII:Facial symmetry noted.   DIAGNOSTIC DATA (LABS, IMAGING, TESTING) - I reviewed patient records, labs, notes, testing and imaging myself where available.     ASSESSMENT AND PLAN 38 y.o. year old male  has a past medical history of Common migraine with intractable migraine (02/12/2017), Heart burn, and Migraines. here with:  Migraine   -Decrease nortriptyline to 1 capsule nightly for 4 days then stop medication  -Continue Zonegran 100 mg at bedtime -Continue Zomig for abortive therapy -Follow-up in 6 months or sooner if needed    Butch Penny, MSN, NP-C 02/19/2024, 9:31 AM [facility] [address], [address] [place], [place] [address] [phone]

## 2025-02-21 ENCOUNTER — Telehealth: Payer: Self-pay | Admitting: Adult Health
